# Patient Record
Sex: Male | Born: 1980 | Race: White | Hispanic: No | Marital: Married | Smoking: Current every day smoker
Health system: Southern US, Community
[De-identification: ages and names within clinical notes are randomized; demographics above are authoritative.]

## PROBLEM LIST (undated history)

## (undated) DIAGNOSIS — K589 Irritable bowel syndrome without diarrhea: Secondary | ICD-10-CM

## (undated) DIAGNOSIS — Z973 Presence of spectacles and contact lenses: Secondary | ICD-10-CM

## (undated) DIAGNOSIS — E66811 Obesity, class 1: Secondary | ICD-10-CM

## (undated) DIAGNOSIS — K219 Gastro-esophageal reflux disease without esophagitis: Secondary | ICD-10-CM

## (undated) DIAGNOSIS — Z87442 Personal history of urinary calculi: Secondary | ICD-10-CM

## (undated) DIAGNOSIS — E669 Obesity, unspecified: Secondary | ICD-10-CM

## (undated) DIAGNOSIS — E8881 Metabolic syndrome: Secondary | ICD-10-CM

## (undated) DIAGNOSIS — G4733 Obstructive sleep apnea (adult) (pediatric): Secondary | ICD-10-CM

## (undated) DIAGNOSIS — R519 Headache, unspecified: Secondary | ICD-10-CM

## (undated) DIAGNOSIS — R51 Headache: Secondary | ICD-10-CM

## (undated) HISTORY — DX: Irritable bowel syndrome, unspecified: K58.9

## (undated) HISTORY — PX: LEG SURGERY: SHX1003

## (undated) HISTORY — DX: Obesity, unspecified: E66.9

## (undated) HISTORY — DX: Obesity, class 1: E66.811

## (undated) HISTORY — PX: MULTIPLE TOOTH EXTRACTIONS: SHX2053

## (undated) HISTORY — DX: Metabolic syndrome: E88.81

---
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
## 2003-11-18 ENCOUNTER — Emergency Department (HOSPITAL_COMMUNITY): Admission: EM | Admit: 2003-11-18 | Discharge: 2003-11-18 | Payer: Self-pay | Admitting: Emergency Medicine

## 2005-12-26 ENCOUNTER — Emergency Department (HOSPITAL_COMMUNITY): Admission: EM | Admit: 2005-12-26 | Discharge: 2005-12-26 | Payer: Self-pay | Admitting: Emergency Medicine

## 2005-12-29 ENCOUNTER — Encounter: Admission: RE | Admit: 2005-12-29 | Discharge: 2005-12-29 | Payer: Self-pay | Admitting: Orthopedic Surgery

## 2006-01-06 ENCOUNTER — Ambulatory Visit (HOSPITAL_BASED_OUTPATIENT_CLINIC_OR_DEPARTMENT_OTHER): Admission: RE | Admit: 2006-01-06 | Discharge: 2006-01-07 | Payer: Self-pay | Admitting: Orthopedic Surgery

## 2007-09-20 ENCOUNTER — Encounter: Admission: RE | Admit: 2007-09-20 | Discharge: 2007-09-20 | Payer: Self-pay | Admitting: Family Medicine

## 2008-06-27 ENCOUNTER — Emergency Department (HOSPITAL_BASED_OUTPATIENT_CLINIC_OR_DEPARTMENT_OTHER): Admission: EM | Admit: 2008-06-27 | Discharge: 2008-06-28 | Payer: Self-pay | Admitting: Emergency Medicine

## 2010-10-07 LAB — BASIC METABOLIC PANEL
BUN: 21 mg/dL (ref 6–23)
CO2: 25 mEq/L (ref 19–32)
Calcium: 9.5 mg/dL (ref 8.4–10.5)
Chloride: 105 mEq/L (ref 96–112)
Creatinine, Ser: 0.8 mg/dL (ref 0.4–1.5)
GFR calc Af Amer: >60 mL/min (ref >60)
GFR calc non Af Amer: >60 mL/min (ref >60)
Glucose, Bld: 96 mg/dL (ref 70–99)
Potassium: 3.8 mEq/L (ref 3.5–5.1)
Sodium: 140 mEq/L (ref 135–145)

## 2010-11-08 NOTE — Op Note (Signed)
NAME:  Caleb Norris, Caleb Norris             ACCOUNT NO.:  [id number]   MEDICAL RECORD NO.:  [id number]          PATIENT TYPE:  AMB   LOCATION:  DSC                          FACILITY:  [facility]   PHYSICIAN:  Leonides Grills, M.D.     DATE OF BIRTH:  1981-03-27   DATE OF PROCEDURE:  DATE OF DISCHARGE:                                 OPERATIVE REPORT   PREOPERATIVE DIAGNOSES:  1.  Right closed pilon fracture.  2.  Right closed tibial shaft fracture.   POSTOPERATIVE DIAGNOSES:  1.  Right closed pilon fracture.  2.  Right closed tibial shaft fracture.   OPERATION:  1.  Open reduction internal fixation right pilon fracture.  2.  Open reduction internal fixation right tibial shaft fracture.  3.  Stress x-rays, right ankle.   ANESTHESIA:  General.   SURGEON:  Leonides Grills, MD   ASSISTANT:  None.   COMPLICATIONS:  None.   DISPOSITION:  Stable to PR.   INDICATIONS:  This is a 30 year old male who sustained the above injury.  He  has consented to the above procedure.  All risks of the surgery include  infection, neurovascular injury, non malunion, hardware irritation, hardware  failure, persistent pain, worse pain, arthritis and possible future surgery  were all explained.  Questions were encouraged and answered .  The patient  was brought to the operating room, placed in the supine after adequate  general endotracheal tube anesthesia was administered as well as Ancef 1 g  IV piggyback.  A bump was placed on the right ipsilateral hip, slightly  internal rotated the right lower extremity, and the right lower extremity  was prepped and draped in a sterile manner and proximal thigh tourniquet  placed and the tourniquet was inflated to 90 mmHg.  Under C-arm guidance,  the fracture site was mapped out.  Once this done, we then made a longitude  incision to the posterior lateral aspect of the tibia.  Dissection was  carried down through the skin.  Hemostasis was obtained.  Fascia was incised  over  the superficial posterior compartment and then dissection was carried  down directly onto the tibia, and soft tissue was then elevated off the  fracture site and medially.  We then made a longitude incision midline over  the fracture site just posterolaterally, and with a nick and spread  technique down to bone.  We then applied a 6-hole one-third tubular plate to  the fracture site once the fracture site was debrided and near anatomical  reduction with a reduction clamp.  With the plate applied, we then reduced  the fracture anatomically.  This was visualized under C-arm guidance AP and  lateral planes.  It was mostly out to length.  There was still a rotational  component on the lateral aspect of the tibia that was near anatomic, but we  did not want to devitalize the bone and do a separate exposure for this.  We  then placed four 3.5 mm fully threaded cortical set screws using 2.5 mm  drill holes respectively.  This had excellent purchase and maintenance of  the reduction.  Stress x-rays were obtained in AP and lateral planes that  showed near anatomic reduction with the fracture out length, fixation to  proper position,k as well as no gross motion across the fracture site.  We  then, under C-arm guidance, identified the ankle joint and then made a  longitude incision anterolaterally about the ankle.  Dissection was carried  out through skin.  Hemostasis was obtained.  Peres tertius was then  retracted medially, and the superficial perineal was identified and  retracted out of harm's way as well.  We then went directly down to the  distal anterolateral tibia, and under C-arm guidance we placed two 3.5 mm  fully threaded cortical lag screws using 3.5, 2.5 mm drill holes  respectively.  This had excellent purchase and compression of the fracture  site.  Under C-arm guidance AP lateral mortis views showed anatomic  reduction of the pilon fracture and all planes.  The area was copiously   irrigated with normal saline.  Tourniquet was deflated.  Hemostasis was  obtained.  Compartments were soft before and after the procedure, the foot  and leg.  And prior to this surgery as well, there were wrinkles in the  skin.  Once the wounds were copiously irrigated with normal saline, the  subcu was closed with 3-0 Vicryl and 2-0 Vicryl.  The skin was closed with 4-  0 nylon over all wounds.  Sterile dressing was applied.  Modified Jones  dressing was applied.  Patient was stable to PR.      Leonides Grills, M.D.  Electronically Signed     PB/MEDQ  D:  01/06/2006  T:  01/07/2006  Job:  [id number]

## 2015-02-06 ENCOUNTER — Other Ambulatory Visit: Payer: Self-pay | Admitting: Physician Assistant

## 2015-02-06 DIAGNOSIS — IMO0002 Reserved for concepts with insufficient information to code with codable children: Secondary | ICD-10-CM

## 2015-02-06 DIAGNOSIS — R229 Localized swelling, mass and lump, unspecified: Principal | ICD-10-CM

## 2015-02-07 ENCOUNTER — Ambulatory Visit (INDEPENDENT_AMBULATORY_CARE_PROVIDER_SITE_OTHER): Payer: PRIVATE HEALTH INSURANCE

## 2015-02-07 DIAGNOSIS — R229 Localized swelling, mass and lump, unspecified: Secondary | ICD-10-CM | POA: Diagnosis not present

## 2015-02-07 DIAGNOSIS — IMO0002 Reserved for concepts with insufficient information to code with codable children: Secondary | ICD-10-CM

## 2015-02-08 ENCOUNTER — Other Ambulatory Visit: Payer: Self-pay | Admitting: Physician Assistant

## 2015-02-08 ENCOUNTER — Ambulatory Visit (INDEPENDENT_AMBULATORY_CARE_PROVIDER_SITE_OTHER): Payer: PRIVATE HEALTH INSURANCE

## 2015-02-08 DIAGNOSIS — R221 Localized swelling, mass and lump, neck: Secondary | ICD-10-CM

## 2015-02-08 MED ORDER — IOHEXOL 300 MG/ML  SOLN: 75.0000 mL | Freq: Once | INTRAMUSCULAR | Status: DC | PRN

## 2015-02-15 ENCOUNTER — Other Ambulatory Visit: Payer: Self-pay | Admitting: Otolaryngology

## 2015-02-15 ENCOUNTER — Other Ambulatory Visit (HOSPITAL_COMMUNITY)
Admission: RE | Admit: 2015-02-15 | Discharge: 2015-02-15 | Disposition: A | Discharge status: Home / Self Care | Payer: PRIVATE HEALTH INSURANCE | Source: Ambulatory Visit | Attending: Otolaryngology | Admitting: Otolaryngology

## 2015-02-15 DIAGNOSIS — R221 Localized swelling, mass and lump, neck: Secondary | ICD-10-CM | POA: Insufficient documentation

## 2015-02-23 ENCOUNTER — Other Ambulatory Visit (HOSPITAL_COMMUNITY): Payer: Self-pay | Admitting: Otolaryngology

## 2015-03-12 ENCOUNTER — Encounter (HOSPITAL_COMMUNITY): Payer: Self-pay

## 2015-03-12 ENCOUNTER — Encounter (HOSPITAL_COMMUNITY)
Admission: RE | Admit: 2015-03-12 | Discharge: 2015-03-12 | Disposition: A | Discharge status: Home / Self Care | Payer: PRIVATE HEALTH INSURANCE | Source: Ambulatory Visit | Attending: Otolaryngology | Admitting: Otolaryngology

## 2015-03-12 DIAGNOSIS — Z01812 Encounter for preprocedural laboratory examination: Secondary | ICD-10-CM | POA: Insufficient documentation

## 2015-03-12 DIAGNOSIS — R221 Localized swelling, mass and lump, neck: Secondary | ICD-10-CM | POA: Insufficient documentation

## 2015-03-12 HISTORY — DX: Headache: R51

## 2015-03-12 HISTORY — DX: Personal history of urinary calculi: Z87.442

## 2015-03-12 HISTORY — DX: Gastro-esophageal reflux disease without esophagitis: K21.9

## 2015-03-12 HISTORY — DX: Headache, unspecified: R51.9

## 2015-03-12 LAB — CBC
HCT: 41.1 % (ref 39.0–52.0)
Hemoglobin: 13.4 g/dL (ref 13.0–17.0)
MCH: 27.6 pg (ref 26.0–34.0)
MCHC: 32.6 g/dL (ref 30.0–36.0)
MCV: 84.6 fL (ref 78.0–100.0)
Platelets: 330 10*3/uL (ref 150–400)
RBC: 4.86 MIL/uL (ref 4.22–5.81)
RDW: 12.7 % (ref 11.5–15.5)
WBC: 10.3 10*3/uL (ref 4.0–10.5)

## 2015-03-12 LAB — BASIC METABOLIC PANEL
Anion gap: 13 (ref 5–15)
BUN: 11 mg/dL (ref 6–20)
CO2: 22 mmol/L (ref 22–32)
Calcium: 9.5 mg/dL (ref 8.9–10.3)
Chloride: 104 mmol/L (ref 101–111)
Creatinine, Ser: 0.98 mg/dL (ref 0.61–1.24)
GFR calc Af Amer: >60 mL/min (ref >60)
GFR calc non Af Amer: >60 mL/min (ref >60)
Glucose, Bld: 101 mg/dL — ABNORMAL HIGH (ref 65–99)
Potassium: 3.8 mmol/L (ref 3.5–5.1)
Sodium: 139 mmol/L (ref 135–145)

## 2015-03-12 NOTE — Pre-Procedure Instructions (Signed)
    Caleb Norris  03/12/2015     No Pharmacies Listed   Your procedure is scheduled on Friday, September 23rd, 2016.  Report to [facility] Admitting at 5:30 A.M.  Call this number if you have problems the morning of surgery:  [phone]   Remember:  Do not eat food or drink liquids after midnight.  Take these medicines the morning of surgery with A SIP OF WATER: Protonix and Amitriptyline.   Stop taking: NSAIDs, Aspirin, Aleve, Naproxen, Ibuprofen, BC's, Goody's, Motrin, Advil, fish oil, all herbal medications, and all vitamins.    Do not wear jewelry.  Do not wear lotions, powders, or colognes.  You may wear deodorant.  Men may shave face and neck.  Do not bring valuables to the hospital.  [facility] is not responsible for any belongings or valuables.  Contacts, dentures or bridgework may not be worn into surgery.  Leave your suitcase in the car.  After surgery it may be brought to your room.  For patients admitted to the hospital, discharge time will be determined by your treatment team.  Patients discharged the day of surgery will not be allowed to drive home.   Special instructions:  See attached.   Please read over the following fact sheets that you were given. Pain Booklet, Coughing and Deep Breathing and Surgical Site Infection Prevention

## 2015-03-16 ENCOUNTER — Ambulatory Visit (HOSPITAL_COMMUNITY): Payer: PRIVATE HEALTH INSURANCE | Admitting: Anesthesiology

## 2015-03-16 ENCOUNTER — Encounter (HOSPITAL_COMMUNITY): Payer: Self-pay | Admitting: *Deleted

## 2015-03-16 ENCOUNTER — Observation Stay (HOSPITAL_COMMUNITY)
Admission: RE | Admit: 2015-03-16 | Discharge: 2015-03-17 | Disposition: A | Discharge status: Home / Self Care | Payer: PRIVATE HEALTH INSURANCE | Source: Ambulatory Visit | Attending: Otolaryngology | Admitting: Otolaryngology

## 2015-03-16 ENCOUNTER — Encounter (HOSPITAL_COMMUNITY)
Admission: RE | Disposition: A | Discharge status: Home / Self Care | Payer: Self-pay | Source: Ambulatory Visit | Attending: Otolaryngology

## 2015-03-16 DIAGNOSIS — Z87891 Personal history of nicotine dependence: Secondary | ICD-10-CM | POA: Diagnosis not present

## 2015-03-16 DIAGNOSIS — Z6833 Body mass index (BMI) 33.0-33.9, adult: Secondary | ICD-10-CM | POA: Diagnosis not present

## 2015-03-16 DIAGNOSIS — K219 Gastro-esophageal reflux disease without esophagitis: Secondary | ICD-10-CM | POA: Diagnosis not present

## 2015-03-16 DIAGNOSIS — Q180 Sinus, fistula and cyst of branchial cleft: Secondary | ICD-10-CM | POA: Diagnosis not present

## 2015-03-16 HISTORY — PX: THYROGLOSSAL DUCT CYST: SHX297

## 2015-03-16 SURGERY — EXCISION, THYROGLOSSAL DUCT CYST
Anesthesia: General | Site: Neck | Laterality: Left

## 2015-03-16 MED ORDER — GUAIFENESIN-DM 100-10 MG/5ML PO SYRP: 10.0000 mL | ORAL_SOLUTION | ORAL | Status: DC | PRN

## 2015-03-16 MED ORDER — LIDOCAINE-EPINEPHRINE 1 %-1:100000 IJ SOLN
INTRAMUSCULAR | Status: AC
  Filled 2015-03-16: qty 1

## 2015-03-16 MED ORDER — PANTOPRAZOLE SODIUM 40 MG PO TBEC: 40.0000 mg | DELAYED_RELEASE_TABLET | Freq: Every day | ORAL | Status: DC

## 2015-03-16 MED ORDER — HYDROCORTISONE 1 % EX CREA
1.0000 "application " | TOPICAL_CREAM | Freq: Three times a day (TID) | CUTANEOUS | Status: DC | PRN
  Filled 2015-03-16: qty 28

## 2015-03-16 MED ORDER — GLYCOPYRROLATE 0.2 MG/ML IJ SOLN
INTRAMUSCULAR | Status: AC
  Filled 2015-03-16: qty 1

## 2015-03-16 MED ORDER — FENTANYL CITRATE (PF) 100 MCG/2ML IJ SOLN
25.0000 ug | INTRAMUSCULAR | Status: DC | PRN
  Administered 2015-03-16 (×2): 50 ug via INTRAVENOUS

## 2015-03-16 MED ORDER — DIPHENHYDRAMINE HCL 50 MG/ML IJ SOLN: 25.0000 mg | Freq: Four times a day (QID) | INTRAMUSCULAR | Status: DC | PRN

## 2015-03-16 MED ORDER — PROPOFOL 10 MG/ML IV BOLUS
INTRAVENOUS | Status: DC | PRN
  Administered 2015-03-16: 160 mg via INTRAVENOUS
  Administered 2015-03-16: 40 mg via INTRAVENOUS

## 2015-03-16 MED ORDER — NEOSTIGMINE METHYLSULFATE 10 MG/10ML IV SOLN
INTRAVENOUS | Status: AC
  Filled 2015-03-16: qty 1

## 2015-03-16 MED ORDER — ARTIFICIAL TEARS OP OINT
TOPICAL_OINTMENT | OPHTHALMIC | Status: DC | PRN
  Administered 2015-03-16: 1 via OPHTHALMIC

## 2015-03-16 MED ORDER — LIDOCAINE HCL (CARDIAC) 20 MG/ML IV SOLN
INTRAVENOUS | Status: AC
  Filled 2015-03-16: qty 10

## 2015-03-16 MED ORDER — DOCUSATE SODIUM 100 MG PO CAPS: 100.0000 mg | ORAL_CAPSULE | Freq: Two times a day (BID) | ORAL | Status: DC | PRN

## 2015-03-16 MED ORDER — FENTANYL CITRATE (PF) 250 MCG/5ML IJ SOLN
INTRAMUSCULAR | Status: AC
  Filled 2015-03-16: qty 5

## 2015-03-16 MED ORDER — METRONIDAZOLE 500 MG PO TABS
500.0000 mg | ORAL_TABLET | Freq: Two times a day (BID) | ORAL | Status: DC
  Administered 2015-03-16: 500 mg via ORAL
  Filled 2015-03-16: qty 1

## 2015-03-16 MED ORDER — VITAMIN B-12 1000 MCG PO TABS: 500.0000 ug | ORAL_TABLET | Freq: Every day | ORAL | Status: DC

## 2015-03-16 MED ORDER — CEFAZOLIN SODIUM 1-5 GM-% IV SOLN
1.0000 g | Freq: Three times a day (TID) | INTRAVENOUS | Status: DC
  Filled 2015-03-16 (×3): qty 50

## 2015-03-16 MED ORDER — 0.9 % SODIUM CHLORIDE (POUR BTL) OPTIME
TOPICAL | Status: DC | PRN
  Administered 2015-03-16: 1000 mL

## 2015-03-16 MED ORDER — MORPHINE SULFATE (PF) 2 MG/ML IV SOLN: 2.0000 mg | INTRAVENOUS | Status: DC | PRN

## 2015-03-16 MED ORDER — KCL IN DEXTROSE-NACL 20-5-0.45 MEQ/L-%-% IV SOLN
INTRAVENOUS | Status: DC
  Administered 2015-03-16: 75 mL/h via INTRAVENOUS
  Administered 2015-03-17: via INTRAVENOUS
  Filled 2015-03-16 (×2): qty 1000

## 2015-03-16 MED ORDER — ALUM & MAG HYDROXIDE-SIMETH 200-200-20 MG/5ML PO SUSP: 30.0000 mL | Freq: Four times a day (QID) | ORAL | Status: DC | PRN

## 2015-03-16 MED ORDER — HYDROCODONE-ACETAMINOPHEN 5-325 MG PO TABS
1.0000 | ORAL_TABLET | ORAL | Status: DC | PRN
  Administered 2015-03-16 – 2015-03-17 (×2): 2 via ORAL
  Filled 2015-03-16 (×3): qty 2

## 2015-03-16 MED ORDER — LIDOCAINE-EPINEPHRINE 1 %-1:100000 IJ SOLN
INTRAMUSCULAR | Status: DC | PRN
  Administered 2015-03-16: 3.5 mL

## 2015-03-16 MED ORDER — ROCURONIUM BROMIDE 50 MG/5ML IV SOLN
INTRAVENOUS | Status: AC
  Filled 2015-03-16: qty 1

## 2015-03-16 MED ORDER — OXYCODONE HCL 5 MG PO TABS: 5.0000 mg | ORAL_TABLET | Freq: Once | ORAL | Status: DC | PRN

## 2015-03-16 MED ORDER — ONDANSETRON HCL 4 MG/2ML IJ SOLN: 4.0000 mg | Freq: Four times a day (QID) | INTRAMUSCULAR | Status: DC | PRN

## 2015-03-16 MED ORDER — ONDANSETRON HCL 4 MG/2ML IJ SOLN
INTRAMUSCULAR | Status: AC
  Filled 2015-03-16: qty 2

## 2015-03-16 MED ORDER — MIDAZOLAM HCL 5 MG/5ML IJ SOLN
INTRAMUSCULAR | Status: DC | PRN
  Administered 2015-03-16: 2 mg via INTRAVENOUS

## 2015-03-16 MED ORDER — LIDOCAINE HCL (CARDIAC) 20 MG/ML IV SOLN
INTRAVENOUS | Status: DC | PRN
  Administered 2015-03-16: 80 mg via INTRAVENOUS

## 2015-03-16 MED ORDER — NEOSTIGMINE METHYLSULFATE 10 MG/10ML IV SOLN
INTRAVENOUS | Status: DC | PRN
  Administered 2015-03-16: 2 mg via INTRAVENOUS

## 2015-03-16 MED ORDER — OXYCODONE HCL 5 MG/5ML PO SOLN: 5.0000 mg | Freq: Once | ORAL | Status: DC | PRN

## 2015-03-16 MED ORDER — ACETAMINOPHEN 325 MG PO TABS: 650.0000 mg | ORAL_TABLET | ORAL | Status: DC | PRN

## 2015-03-16 MED ORDER — GLYCOPYRROLATE 0.2 MG/ML IJ SOLN
INTRAMUSCULAR | Status: DC | PRN
  Administered 2015-03-16: 0.2 mg via INTRAVENOUS

## 2015-03-16 MED ORDER — BACITRACIN ZINC 500 UNIT/GM EX OINT
TOPICAL_OINTMENT | CUTANEOUS | Status: AC
  Filled 2015-03-16: qty 28.35

## 2015-03-16 MED ORDER — B COMPLEX PO TABS: 1.0000 | ORAL_TABLET | Freq: Every day | ORAL | Status: DC

## 2015-03-16 MED ORDER — ONDANSETRON 4 MG PO TBDP: 4.0000 mg | ORAL_TABLET | Freq: Three times a day (TID) | ORAL | Status: DC | PRN

## 2015-03-16 MED ORDER — SUCCINYLCHOLINE CHLORIDE 20 MG/ML IJ SOLN
INTRAMUSCULAR | Status: AC
  Filled 2015-03-16: qty 1

## 2015-03-16 MED ORDER — PHENOL 1.4 % MT LIQD
2.0000 | Freq: Three times a day (TID) | OROMUCOSAL | Status: DC | PRN
  Administered 2015-03-17: 2 via OROMUCOSAL
  Filled 2015-03-16: qty 177

## 2015-03-16 MED ORDER — CLINDAMYCIN HCL 150 MG PO CAPS
150.0000 mg | ORAL_CAPSULE | Freq: Four times a day (QID) | ORAL | Status: DC
  Administered 2015-03-16 – 2015-03-17 (×3): 150 mg via ORAL
  Filled 2015-03-16 (×7): qty 1

## 2015-03-16 MED ORDER — PROPOFOL 10 MG/ML IV BOLUS
INTRAVENOUS | Status: AC
  Filled 2015-03-16: qty 20

## 2015-03-16 MED ORDER — ROCURONIUM BROMIDE 100 MG/10ML IV SOLN
INTRAVENOUS | Status: DC | PRN
  Administered 2015-03-16: 40 mg via INTRAVENOUS

## 2015-03-16 MED ORDER — SODIUM CHLORIDE 0.9 % IJ SOLN
INTRAMUSCULAR | Status: AC
  Filled 2015-03-16: qty 10

## 2015-03-16 MED ORDER — OXYMETAZOLINE HCL 0.05 % NA SOLN
2.0000 | Freq: Three times a day (TID) | NASAL | Status: DC | PRN
  Administered 2015-03-16: 2 via NASAL
  Filled 2015-03-16: qty 15

## 2015-03-16 MED ORDER — LACTATED RINGERS IV SOLN
INTRAVENOUS | Status: DC | PRN
  Administered 2015-03-16 (×2): via INTRAVENOUS

## 2015-03-16 MED ORDER — PHENYLEPHRINE HCL 10 MG/ML IJ SOLN
INTRAMUSCULAR | Status: DC | PRN
  Administered 2015-03-16: 40 ug via INTRAVENOUS

## 2015-03-16 MED ORDER — ACETAMINOPHEN 160 MG/5ML PO SOLN
325.0000 mg | ORAL | Status: DC | PRN
  Filled 2015-03-16: qty 20.3

## 2015-03-16 MED ORDER — AMITRIPTYLINE HCL 10 MG PO TABS: 10.0000 mg | ORAL_TABLET | Freq: Every day | ORAL | Status: DC

## 2015-03-16 MED ORDER — B COMPLEX-C PO TABS
1.0000 | ORAL_TABLET | Freq: Every day | ORAL | Status: DC
  Filled 2015-03-16: qty 1

## 2015-03-16 MED ORDER — MIDAZOLAM HCL 2 MG/2ML IJ SOLN
INTRAMUSCULAR | Status: AC
  Filled 2015-03-16: qty 4

## 2015-03-16 MED ORDER — ONDANSETRON HCL 4 MG/2ML IJ SOLN
INTRAMUSCULAR | Status: DC | PRN
  Administered 2015-03-16: 4 mg via INTRAVENOUS

## 2015-03-16 MED ORDER — EPHEDRINE SULFATE 50 MG/ML IJ SOLN
INTRAMUSCULAR | Status: AC
  Filled 2015-03-16: qty 1

## 2015-03-16 MED ORDER — PHENYLEPHRINE 40 MCG/ML (10ML) SYRINGE FOR IV PUSH (FOR BLOOD PRESSURE SUPPORT)
PREFILLED_SYRINGE | INTRAVENOUS | Status: AC
  Filled 2015-03-16: qty 10

## 2015-03-16 MED ORDER — CEFAZOLIN SODIUM-DEXTROSE 2-3 GM-% IV SOLR
INTRAVENOUS | Status: AC
  Filled 2015-03-16: qty 50

## 2015-03-16 MED ORDER — FENTANYL CITRATE (PF) 100 MCG/2ML IJ SOLN
INTRAMUSCULAR | Status: AC
  Administered 2015-03-16: 50 ug via INTRAVENOUS
  Filled 2015-03-16: qty 2

## 2015-03-16 MED ORDER — ACETAMINOPHEN 325 MG PO TABS: 325.0000 mg | ORAL_TABLET | ORAL | Status: DC | PRN

## 2015-03-16 MED ORDER — KCL IN DEXTROSE-NACL 20-5-0.45 MEQ/L-%-% IV SOLN
INTRAVENOUS | Status: AC
  Administered 2015-03-16: 75 mL/h via INTRAVENOUS
  Filled 2015-03-16: qty 1000

## 2015-03-16 MED ORDER — FENTANYL CITRATE (PF) 100 MCG/2ML IJ SOLN
INTRAMUSCULAR | Status: DC | PRN
  Administered 2015-03-16: 50 ug via INTRAVENOUS
  Administered 2015-03-16: 150 ug via INTRAVENOUS
  Administered 2015-03-16: 50 ug via INTRAVENOUS
  Administered 2015-03-16: 100 ug via INTRAVENOUS

## 2015-03-16 SURGICAL SUPPLY — 59 items
ATTRACTOMAT 16X20 MAGNETIC DRP (DRAPES) IMPLANT
BENZOIN TINCTURE PRP APPL 2/3 (GAUZE/BANDAGES/DRESSINGS) ×2 IMPLANT
BLADE SURG 15 STRL LF DISP TIS (BLADE) ×1 IMPLANT
BLADE SURG 15 STRL SS (BLADE) ×1
BLADE SURG ROTATE 9660 (MISCELLANEOUS) IMPLANT
BNDG GAUZE ELAST 4 BULKY (GAUZE/BANDAGES/DRESSINGS) ×2 IMPLANT
CLEANER TIP ELECTROSURG 2X2 (MISCELLANEOUS) ×2 IMPLANT
CONT SPEC 4OZ CLIKSEAL STRL BL (MISCELLANEOUS) IMPLANT
CORDS BIPOLAR (ELECTRODE) ×2 IMPLANT
COVER SURGICAL LIGHT HANDLE (MISCELLANEOUS) ×2 IMPLANT
CRADLE DONUT ADULT HEAD (MISCELLANEOUS) IMPLANT
DECANTER SPIKE VIAL GLASS SM (MISCELLANEOUS) ×2 IMPLANT
DRAIN HEMOVAC 7FR (DRAIN) ×2 IMPLANT
DRAIN PENROSE 1/4X12 LTX STRL (WOUND CARE) ×2 IMPLANT
DRAPE PROXIMA HALF (DRAPES) IMPLANT
DRAPE SURG 17X23 STRL (DRAPES) ×2 IMPLANT
ELECT COATED BLADE 2.86 ST (ELECTRODE) ×2 IMPLANT
ELECT REM PT RETURN 9FT ADLT (ELECTROSURGICAL) ×2
ELECTRODE REM PT RTRN 9FT ADLT (ELECTROSURGICAL) ×1 IMPLANT
EVACUATOR SILICONE 100CC (DRAIN) ×2 IMPLANT
FORCEPS BIPOLAR SPETZLER 8 1.0 (NEUROSURGERY SUPPLIES) ×2 IMPLANT
GAUZE SPONGE 4X4 12PLY STRL (GAUZE/BANDAGES/DRESSINGS) ×2 IMPLANT
GAUZE SPONGE 4X4 16PLY XRAY LF (GAUZE/BANDAGES/DRESSINGS) ×2 IMPLANT
GLOVE BIO SURGEON STRL SZ7.5 (GLOVE) ×2 IMPLANT
GOWN STRL REUS W/ TWL LRG LVL3 (GOWN DISPOSABLE) ×2 IMPLANT
GOWN STRL REUS W/TWL LRG LVL3 (GOWN DISPOSABLE) ×2
KIT BASIN OR (CUSTOM PROCEDURE TRAY) ×2 IMPLANT
KIT ROOM TURNOVER OR (KITS) ×2 IMPLANT
LIQUID BAND (GAUZE/BANDAGES/DRESSINGS) ×2 IMPLANT
LOCATOR NERVE 3 VOLT (DISPOSABLE) IMPLANT
NEEDLE BIOPSY 14X6 SOFT TISS (NEEDLE) IMPLANT
NEEDLE HYPO 25GX1X1/2 BEV (NEEDLE) IMPLANT
NS IRRIG 1000ML POUR BTL (IV SOLUTION) ×2 IMPLANT
PAD ARMBOARD 7.5X6 YLW CONV (MISCELLANEOUS) ×4 IMPLANT
PENCIL BUTTON HOLSTER BLD 10FT (ELECTRODE) ×2 IMPLANT
SPECIMEN JAR SMALL (MISCELLANEOUS) ×2 IMPLANT
SPONGE INTESTINAL PEANUT (DISPOSABLE) IMPLANT
SPONGE LAP 18X18 X RAY DECT (DISPOSABLE) ×2 IMPLANT
STAPLER VISISTAT 35W (STAPLE) ×2 IMPLANT
SUT ETHILON 2 0 FS 18 (SUTURE) ×4 IMPLANT
SUT ETHILON 5 0 P 3 18 (SUTURE) ×1
SUT NYLON ETHILON 5-0 P-3 1X18 (SUTURE) ×1 IMPLANT
SUT SILK 2 0 SH (SUTURE) ×4 IMPLANT
SUT SILK 3 0 REEL (SUTURE) ×2 IMPLANT
SUT VIC AB 3-0 SH 27 (SUTURE) ×1
SUT VIC AB 3-0 SH 27X BRD (SUTURE) ×1 IMPLANT
SUT VIC AB 4-0 PS2 27 (SUTURE) ×2 IMPLANT
SUT VIC AB 4-0 SH 27 (SUTURE) ×1
SUT VIC AB 4-0 SH 27XBRD (SUTURE) ×1 IMPLANT
SWAB COLLECTION DEVICE MRSA (MISCELLANEOUS) IMPLANT
SYR 5ML LL (SYRINGE) IMPLANT
TOWEL OR 17X24 6PK STRL BLUE (TOWEL DISPOSABLE) ×2 IMPLANT
TRAY ENT MC OR (CUSTOM PROCEDURE TRAY) ×2 IMPLANT
TRAY FOLEY CATH 14FRSI W/METER (CATHETERS) IMPLANT
TUBE ANAEROBIC SPECIMEN COL (MISCELLANEOUS) IMPLANT
TUBE CONNECTING 12X1/4 (SUCTIONS) ×2 IMPLANT
TUBE TRACH SHILEY  6 DIST  CUF (TUBING) IMPLANT
TUBE TRACH SHILEY 8 DIST CUF (TUBING) IMPLANT
WATER STERILE IRR 1000ML POUR (IV SOLUTION) ×2 IMPLANT

## 2015-03-16 NOTE — Anesthesia Preprocedure Evaluation (Addendum)
Anesthesia Evaluation  Patient identified by MRN, date of birth, ID band Patient awake    Reviewed: Allergy & Precautions, NPO status , Patient's Chart, lab work & pertinent test results  History of Anesthesia Complications Negative for: history of anesthetic complications  Airway Mallampati: II  TM Distance: >3 FB Neck ROM: Full    Dental  (+) Teeth Intact   Pulmonary neg shortness of breath, neg sleep apnea, neg COPD, neg recent URI, former smoker,  Quit 5 yrs ago   Pulmonary exam normal breath sounds clear to auscultation       Cardiovascular negative cardio ROS Normal cardiovascular exam Rhythm:Regular Rate:Normal     Neuro/Psych negative neurological ROS  negative psych ROS   GI/Hepatic Neg liver ROS, GERD  Controlled,  Endo/Other  Morbid obesity  Renal/GU negative Renal ROS     Musculoskeletal negative musculoskeletal ROS (+)   Abdominal Normal abdominal exam  (+)   Peds  Hematology negative hematology ROS (+)   Anesthesia Other Findings   Reproductive/Obstetrics negative OB ROS                           Anesthesia Physical Anesthesia Plan  ASA: II  Anesthesia Plan: General   Post-op Pain Management:    Induction: Intravenous  Airway Management Planned: Oral ETT  Additional Equipment: None  Intra-op Plan:   Post-operative Plan: Extubation in OR  Informed Consent: I have reviewed the patients History and Physical, chart, labs and discussed the procedure including the risks, benefits and alternatives for the proposed anesthesia with the patient or authorized representative who has indicated his/her understanding and acceptance.   Dental advisory given  Plan Discussed with: CRNA, Anesthesiologist and Surgeon  Anesthesia Plan Comments:        Anesthesia Quick Evaluation

## 2015-03-16 NOTE — Anesthesia Postprocedure Evaluation (Signed)
  Anesthesia Post-op Note  Patient: Caleb Norris  Procedure(s) Performed: Procedure(s): Excision Left neck cyst (Left)  Patient Location: PACU  Anesthesia Type:General  Level of Consciousness: awake  Airway and Oxygen Therapy: Patient Spontanous Breathing  Post-op Pain: mild  Post-op Assessment: Post-op Vital signs reviewed, Patient's Cardiovascular Status Stable, Respiratory Function Stable, Patent Airway, No signs of Nausea or vomiting and Pain level controlled              Post-op Vital Signs: Reviewed and stable  Last Vitals:  Filed Vitals:   03/16/15 1110  BP: 127/63  Pulse: 86  Temp: 37 C  Resp: 20    Complications: No apparent anesthesia complications

## 2015-03-16 NOTE — Progress Notes (Signed)
Subjective: POD#0 from left neck branchial cleft excision. Expected left neck soreness but otherwise doing well and taking good PO  Objective: Vital signs in last 24 hours: Temp:  [98.6 F (37 C)-99 F (37.2 C)] 98.6 F (37 C) (09/23 1222) Pulse Rate:  [84-92] 88 (09/23 1222) Resp:  [12-20] 20 (09/23 1222) BP: (126-133)/(63-85) 132/85 mmHg (09/23 1222) SpO2:  [95 %-99 %] 98 % (09/23 1222) Weight:  [104.327 kg (230 lb)] 104.327 kg (230 lb) (09/23 1222)  Neck supple, Cn 2-12 intact and symmetric, incision C/D/I with Jp holding suction with ~ 10mL of sanguinous drainage.  (wbc:2,hgb:2,hct:2,plt:2) No results for input(s): NA, K, CL, CO2, GLUCOSE, BUN, CREATININE, CALCIUM in the last 72 hours.  Medications:   Scheduled Meds: . [START ON 03/17/2015] amitriptyline  10 mg Oral Daily  . [START ON 03/17/2015] B-complex with vitamin C  1 tablet Oral Daily  . clindamycin  150 mg Oral 4 times per day  . metroNIDAZOLE  500 mg Oral Q12H  . [START ON 03/17/2015] pantoprazole  40 mg Oral Daily  . cyanocobalamin  500 mcg Oral Daily   Continuous Infusions: . dextrose 5 % and 0.45 % NaCl with KCl 20 mEq/L 75 mL/hr (03/16/15 1104)   PRN Meds:.acetaminophen, diphenhydrAMINE, docusate sodium, HYDROcodone-acetaminophen, morphine injection, ondansetron (ZOFRAN) IV, ondansetron, phenol  Assessment/Plan: Doing well s/p excision left neck branchial cleft cyst, will monitor, likely discharge in the morning after drain removed if drain output appropriate.     Melvenia Beam 03/16/2015, 5:29 PM

## 2015-03-16 NOTE — Progress Notes (Signed)
Arrived to 6n10 from PACU, ambulated to bed-gait steady, oriented to room and surroundings, denies nausea/pain at this time

## 2015-03-16 NOTE — Anesthesia Procedure Notes (Signed)
Procedure Name: Intubation Date/Time: 03/16/2015 8:13 AM Performed by: Izora Gala Pre-anesthesia Checklist: Patient identified, Emergency Drugs available, Suction available and Patient being monitored Patient Re-evaluated:Patient Re-evaluated prior to inductionOxygen Delivery Method: Circle system utilized Preoxygenation: Pre-oxygenation with 100% oxygen Intubation Type: IV induction Ventilation: Mask ventilation without difficulty Laryngoscope Size: Miller and 3 Grade View: Grade I Tube size: 7.5 mm Number of attempts: 1 Airway Equipment and Method: Stylet and LTA kit utilized Placement Confirmation: ETT inserted through vocal cords under direct vision,  positive ETCO2 and breath sounds checked- equal and bilateral Secured at: 22 cm Tube secured with: Tape Dental Injury: Teeth and Oropharynx as per pre-operative assessment

## 2015-03-16 NOTE — Transfer of Care (Signed)
Immediate Anesthesia Transfer of Care Note  Patient: Caleb Norris  Procedure(s) Performed: Procedure(s): Excision Left neck cyst (Left)  Patient Location: PACU  Anesthesia Type:General  Level of Consciousness: awake, alert , oriented and patient cooperative  Airway & Oxygen Therapy: Patient Spontanous Breathing and Patient connected to nasal cannula oxygen  Post-op Assessment: Report given to RN, Post -op Vital signs reviewed and stable and Patient moving all extremities  Post vital signs: Reviewed and stable  Last Vitals:  Filed Vitals:   03/16/15 0556  BP: 133/80  Pulse: 86  Temp: 37.2 C  Resp: 16    Complications: No apparent anesthesia complications

## 2015-03-16 NOTE — Op Note (Signed)
NAME:  Caleb Norris, Caleb Norris NO.:  [id number]  MEDICAL RECORD NO.:  [id number]  LOCATION:  [facility]                         FACILITY:  [facility]  PHYSICIAN:  Antony Contras, MD     DATE OF BIRTH:  [birth year]  DATE OF PROCEDURE:  03/16/2015 DATE OF DISCHARGE:                              OPERATIVE REPORT   PREOPERATIVE DIAGNOSIS:  Left neck branchial cleft cyst.  POSTOPERATIVE DIAGNOSIS:  Left neck branchial cleft cyst.  PROCEDURE:  Excision of left neck branchial cleft cyst.  SURGEON:  Antony Contras, MD  ASSISTANT:  Aquilla Hacker, PA.  ANESTHESIA:  General endotracheal anesthesia.  COMPLICATIONS:  None.  INDICATION:  The patient is a 34 year old male who first noticed a mass in the left neck in early August that has increased in size.  CT imaging demonstrated a cystic mass.  Fine-needle aspiration was inconclusive and a culture from the aspirate was negative.  He presents to the operating room for surgical management.  FINDINGS:  There was a sizable cyst in the left neck in the zone 2-3 region.  The cyst was stuck to the sternocleidomastoid muscle and deep tissues somewhat.  There were couple of satellite lymph nodes that were removed with the cyst.  The cyst extended toward the carotid bifurcation, but did not pass through that area.  The cyst was sent for Pathology.  The cyst did rupture during surgery decompressing partially yellow creamy fluid.  DESCRIPTION OF PROCEDURE:  The patient was identified in the holding room and informed consent having been obtained after discussion of risks, benefits, alternatives, the patient was brought to the operative suite and put on the operating table in a supine position.  Anesthesia was induced and the patient was intubated by the anesthesia team without difficulty.  The patient was given intravenous antibiotics during the case.  The eyes were taped closed and a shoulder roll was placed.  The left neck was prepped and  draped in sterile fashion after first marking the incision with a marking pen and injected with 1% lidocaine with 1:100,000 epinephrine.  After prepping, the neck incision was then made with a 15-blade scalpel and extended through the subcutaneous and platysma layer using Bovie electrocautery.  Subplatysmal flap was elevated superiorly and the superior flap was sutured superiorly to the drapes.  Dissection was then performed down onto the cyst inferiorly and dissection was then performed around the cyst staying along the cyst wall.  This started posteriorly dividing it from the sternocleidomastoid muscle, where muscle fibers had to be transected to allow the cyst to be dissected free.  Continued dissection was then performed over the lateral surface superiorly dividing tissues and staying along the cyst wall.  The digastric muscle was identified superiorly and dissected free.  Dissection was then performed around the more medial surface superior to inferior.  Inferiorly, the cyst was more stuck and was dissected carefully from surrounding tissues.  Ultimately, the jugular vein was identified inferiorly.  Continued dissection around the posterior surface divided the cyst further from the sternocleidomastoid muscle.  More superiorly, the spinal accessory nerve was identified and kept free from the dissection area while dividing tissues further.  It was in this  area that the cyst ruptured and cloudy yellow fluid was suctioned out of the wound until it stopped draining.  Continued dissection was then performed around the anterior and medial surface carefully dissecting the jugular vein from the cyst.  At this point, the cyst was able to be dissected from deep tissues using Bovie electrocautery.  There was an extension of the cyst inferiorly as well as superiorly that were dissected free from the deeper tissues.  The superior extension was oriented toward the carotid bifurcation, but ended  short of that.  Continued dissection was performed under the deep surface of the cyst until it was able to be removed along with a couple of satellite lymph nodes and the cyst was passed to nursing for Pathology.  The wound was then copiously irrigated with saline.  The wound was carefully inspected for bleeding including with a Valsalva maneuver, and there was no extra bleeding seen.  A 7-French round drain was placed in the depth of the wound and secured to the skin edge using 2-0 nylon suture in a standard drain stitch.  The platysma layer was closed with 3-0 Vicryl suture in a simple running fashion.  The drain was hooked to suction at this point.  The skin was closed in the subcutaneous layer using 4-0 Vicryl suture in a simple running fashion. The drain was then converted to bulb suction and the skin was then sealed with Dermabond.  After cleaning off the patient and removing the drapes, the drain was taped to the left shoulder.  He was then returned to anesthesia for wake up, he was extubated and moved to the recovery room in a stable condition.     Antony Contras, MD     DDB/MEDQ  D:  03/16/2015  T:  03/16/2015  Job:  [id number]

## 2015-03-16 NOTE — H&P (Signed)
Caleb Norris is an 34 y.o. male.   Chief Complaint: left neck cyst HPI: 34 year old male noticed a mass in the left neck in early August.  CT imaging suggested a cystic mass.  FNA was non-diagnostic.  Culture from the aspirate was negative.  He presents for surgical excision.  Past Medical History  Diagnosis Date  . GERD (gastroesophageal reflux disease)   . Urinary frequency   . History of kidney stones   . Headache     occassionally    Past Surgical History  Procedure Laterality Date  . Leg surgery Right   . Multiple tooth extractions      History reviewed. No pertinent family history. Social History:  reports that he quit smoking about 5 years ago. His smoking use included Cigarettes. He has never used smokeless tobacco. He reports that he drinks about 1.8 - 2.4 oz of alcohol per week. He reports that he does not use illicit drugs.  Allergies:  Allergies  Allergen Reactions  . Compazine [Prochlorperazine Edisylate] Anaphylaxis    Medications Prior to Admission  Medication Sig Dispense Refill  . amitriptyline (ELAVIL) 10 MG tablet Take 10 mg by mouth daily. For IBS  5  . b complex vitamins tablet Take 1 tablet by mouth daily.    . cyanocobalamin 500 MCG tablet Take 500 mcg by mouth daily. Vitamin B12    . pantoprazole (PROTONIX) 40 MG tablet Take 40 mg by mouth daily.      No results found for this or any previous visit (from the past 48 hour(s)). No results found.  Review of Systems  All other systems reviewed and are negative.   Blood pressure 133/80, pulse 86, temperature 99 F (37.2 C), temperature source Oral, resp. rate 16, SpO2 99 %. Physical Exam  Constitutional: He is oriented to person, place, and time. He appears well-developed and well-nourished. No distress.  HENT:  Head: Normocephalic and atraumatic.  Right Ear: External ear normal.  Left Ear: External ear normal.  Nose: Nose normal.  Mouth/Throat: Oropharynx is clear and moist.  Eyes:  Conjunctivae and EOM are normal. Pupils are equal, round, and reactive to light.  Neck: Normal range of motion. Neck supple.  Left neck with firm, sizeable mass in zone 2-3.  Cardiovascular: Normal rate.   Respiratory: Effort normal.  Musculoskeletal: Normal range of motion.  Neurological: He is alert and oriented to person, place, and time. No cranial nerve deficit.  Skin: Skin is warm and dry.  Psychiatric: He has a normal mood and affect. His behavior is normal. Judgment and thought content normal.     Assessment/Plan Left branchial cleft cyst To OR for excision of left branchial cleft cyst, observe overnight with drain in place.  Avielle Imbert 03/16/2015, 7:51 AM

## 2015-03-16 NOTE — Discharge Summary (Signed)
03/17/15  6:58 AM  Date of Admission: 03/16/2015 Date of Discharge: 03/17/15  Discharge MD: Melvenia Beam, MD  Admitting MD: Christia Reading, MD  Reason for admission/final discharge diagnosis: left neck mass  Labs:see EPIC  Procedure(s) performed: excision left neck mass 03/16/15  Discharge Condition: good  Discharge Exam: Cn 2-12 intact and symmetric, left neck incision intact, left neck drain removed  Discharge Instructions: No heavy lifting, follow up with  Dr. Jenne Pane At [facility] in 1 week, keep incision dry x 72 hours then can clean gently, Rx for clindamycin, zofran, and hydrocodone on chart  Hospital Course: did well after excision left neck mass and drain removed/discharged POD#1  Melvenia Beam 03/17/15

## 2015-03-16 NOTE — Brief Op Note (Signed)
03/16/2015  10:29 AM  PATIENT:  Caleb Norris  34 y.o. male  PRE-OPERATIVE DIAGNOSIS:  left neck cyst  POST-OPERATIVE DIAGNOSIS:  left neck cyst  PROCEDURE:  Procedure(s): Excision Left neck cyst (Left)  SURGEON:  Surgeon(s) and Role:    * Christia Reading, MD - Primary  PHYSICIAN ASSISTANT:   ASSISTANTS: none   ANESTHESIA:   general  EBL:  Total I/O In: 1000 [I.V.:1000] Out: -   BLOOD ADMINISTERED:none  DRAINS: (7 Fr) Jackson-Pratt drain(s) with closed bulb suction in the left neck   LOCAL MEDICATIONS USED:  LIDOCAINE   SPECIMEN:  Source of Specimen:  left neck cyst  DISPOSITION OF SPECIMEN:  PATHOLOGY  COUNTS:  YES  TOURNIQUET:  * No tourniquets in log *  DICTATION: .Other Dictation: Dictation Number [id number]  PLAN OF CARE: Admit for overnight observation  PATIENT DISPOSITION:  PACU - hemodynamically stable.   Delay start of Pharmacological VTE agent (>24hrs) due to surgical blood loss or risk of bleeding: yes

## 2015-03-17 DIAGNOSIS — Q180 Sinus, fistula and cyst of branchial cleft: Secondary | ICD-10-CM | POA: Diagnosis not present

## 2015-03-17 MED ORDER — CLINDAMYCIN HCL 150 MG PO CAPS: 150.0000 mg | ORAL_CAPSULE | Freq: Four times a day (QID) | ORAL | Status: DC

## 2015-03-17 MED ORDER — ONDANSETRON 4 MG PO TBDP: 4.0000 mg | ORAL_TABLET | Freq: Three times a day (TID) | ORAL | Status: DC | PRN

## 2015-03-17 MED ORDER — HYDROCODONE-ACETAMINOPHEN 5-325 MG PO TABS: 1.0000 | ORAL_TABLET | ORAL | Status: DC | PRN

## 2015-03-17 NOTE — Progress Notes (Signed)
Caleb Norris to be D/C'd Home per MD order.  Discussed with the patient and all questions fully answered.  VSS, Skin clean, dry and intact without evidence of skin break down, no evidence of skin tears noted. IV catheter discontinued intact. Site without signs and symptoms of complications. Dressing and pressure applied.  An After Visit Summary was printed and given to the patient. Patient received prescription.  D/c education completed with patient/family including follow up instructions, medication list, d/c activities limitations if indicated, with other d/c instructions as indicated by MD - patient able to verbalize understanding, all questions fully answered.   Patient instructed to return to ED, call 911, or call MD for any changes in condition.   Patient escorted via WC, and D/C home via private auto.  Alonza Bogus 03/17/2015 7:31 AM

## 2015-03-17 NOTE — Discharge Instructions (Signed)
Avoid strenuous activity.  Keep head elevated.  Do not apply any ointment on the incision.  OK to allow incision to get wet after 72 hours, gently pat dry. No heavy lifting, follow up with  Dr. Jenne Pane At [facility] in 1 week, keep incision dry x 72 hours then can clean gently, Rx for clindamycin, zofran, and hydrocodone on chart

## 2015-03-19 ENCOUNTER — Encounter (HOSPITAL_COMMUNITY): Payer: Self-pay | Admitting: Otolaryngology

## 2016-06-09 IMAGING — CT CT NECK W/ CM
1 of 6 series · 3 of 14 positions shown, 4 images · IV contrast (omnipaque)
Comparison: Neck ultrasound 02/07/2015.

CLINICAL DATA: 34-year-old male with recently discovered and
reportedly rapidly enlarging left neck mass. Subsequent encounter.

EXAM:
CT NECK WITH CONTRAST
TECHNIQUE: Multidetector CT imaging of the neck was performed using the
standard protocol following the bolus administration of intravenous
contrast.
CONTRAST:  75 mL Omnipaque 300

[Series 401: sag b · axial · 0.41mm/px · z∈[-120,+1]mm · 3 of 95 slices shown, 4 images]
[im 1/95  soft-tissue]
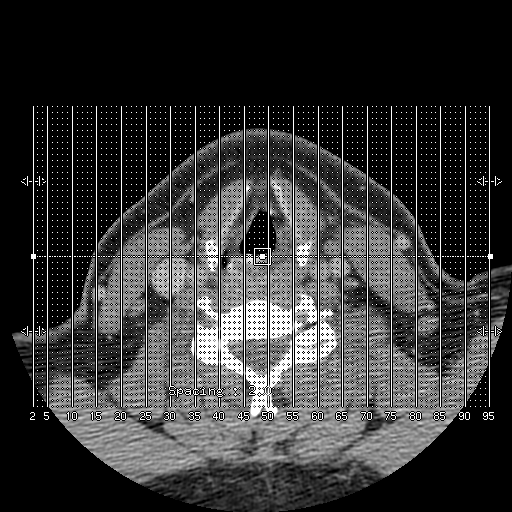
[im 1/95  bone]
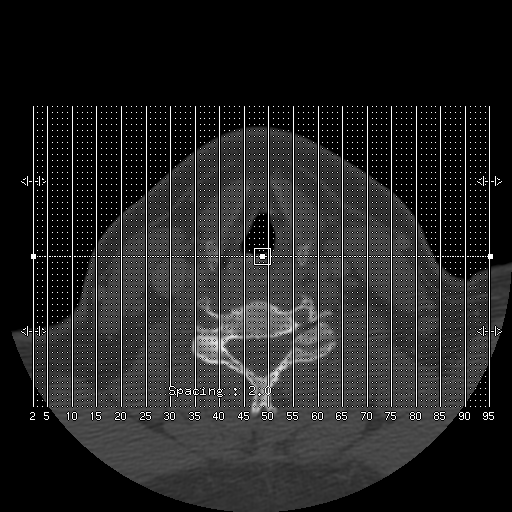
[im 48/95  bone]
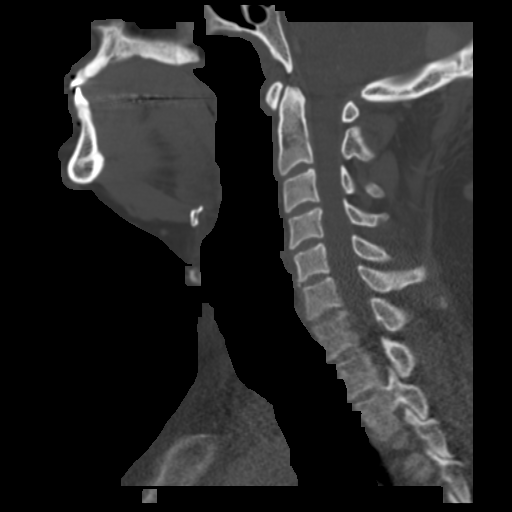
[im 95/95  bone]
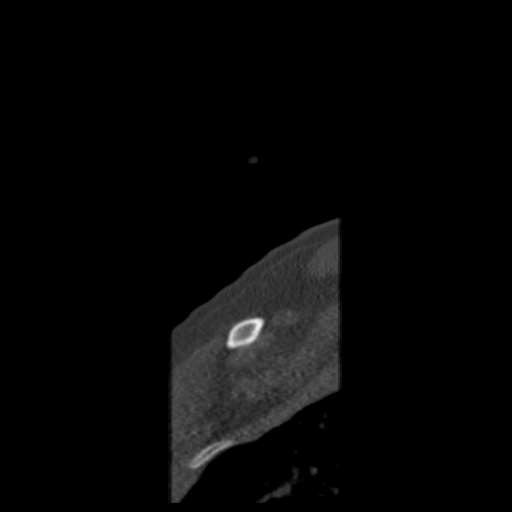

[3 of 14 positions shown; findings below may reference images not displayed]

FINDINGS: Pharynx and larynx: Mild motion artifact. Laryngeal and pharyngeal
soft tissue contours are normal. Negative parapharyngeal spaces.
Occasional tonsillar pillar dystrophic calcifications (post
inflammatory). Negative retropharyngeal space.

Salivary glands: Mild mass effect on the left submandibular gland
which otherwise appears normal. The right submandibular gland, both
parotid glands, and sublingual space are within normal limits.

Thyroid: Negative.

Lymph nodes:

Palpable area of concern marked at the left neck on series 2, image
31. Centered at the left level 2 nodal station there is a large
predominantly cystic 4.4 cm diameter mass with an enhancing soft
tissue or am anteriorly and laterally measuring up to 3-4 mm in
thickness (series 2, image 35). The internal contents of the cystic
lesion have simple fluid densitometry (17 Hounsfield units) and
appear unilocular as on the ultrasound. No hematocrit level
identified. No surrounding soft tissue stranding. Regional mass
effect, including only mild mass effect on the left carotid space
structures. Surrounding left level 1 through level 3 lymph nodes
appear normal.

Other bilateral cervical lymph node stations are normal.

Vascular: Major vascular structures in the neck and at the skullbase
are patent. There is mass effect on the left internal jugular vein.

Limited intracranial: Negative.

Visualized orbits: Not included

Mastoids and visualized paranasal sinuses: Right maxillary sinus
mucous retention cyst. Other Visualized paranasal sinuses and
mastoids are clear.

Skeleton: Mild cervicothoracic scoliosis. No other osseous
abnormality identified.

Upper chest: Negative.  No superior mediastinal lymphadenopathy.
IMPRESSION: 1. Left neck 4.4 cm diameter predominantly cystic mass re-
demonstrated. This is centered at the left level IIa nodal station
and there is a thin (3-4 mm) soft tissue rim along the anterior and
lateral aspect of the lesion.
2. No surrounding inflammation. No pharyngeal mass identified. The
adjacent left level 2 and other bilateral lymph nodes are normal.
3. Strongly favor a benign branchial cleft cyst. The differential
diagnosis does include a necrotic lymph node, so histologic
correlation is necessary.

## 2016-06-23 DIAGNOSIS — E8881 Metabolic syndrome: Secondary | ICD-10-CM

## 2016-06-23 HISTORY — DX: Metabolic syndrome: E88.810

## 2016-06-23 HISTORY — DX: Metabolic syndrome: E88.81

## 2017-03-16 ENCOUNTER — Ambulatory Visit (INDEPENDENT_AMBULATORY_CARE_PROVIDER_SITE_OTHER): Payer: BLUE CROSS/BLUE SHIELD | Admitting: Family Medicine

## 2017-03-16 ENCOUNTER — Encounter: Payer: Self-pay | Admitting: Family Medicine

## 2017-03-16 VITALS — BP 123/78 | HR 87 | Temp 98.0°F | Resp 16 | Ht 69.75 in | Wt 239.2 lb

## 2017-03-16 DIAGNOSIS — K219 Gastro-esophageal reflux disease without esophagitis: Secondary | ICD-10-CM

## 2017-03-16 DIAGNOSIS — Z23 Encounter for immunization: Secondary | ICD-10-CM

## 2017-03-16 DIAGNOSIS — K580 Irritable bowel syndrome with diarrhea: Secondary | ICD-10-CM

## 2017-03-16 MED ORDER — AMITRIPTYLINE HCL 10 MG PO TABS: ORAL_TABLET | ORAL | 3 refills | Status: DC

## 2017-03-16 NOTE — Progress Notes (Signed)
Office Note 03/16/2017  CC:  Chief Complaint  Patient presents with  . Establish Care    Previous PCP: Dr. Tommi Emery at [facility] in [place]  . Follow-up    IBS, needs medication refills    HPI:  Caleb Norris is a 36 y.o. male who is here to establish care Patient's most recent primary MD: Lovenia Kim at [facility] in [place]. Old records in EPIC/[facility] EMR were reviewed prior to or during today's visit.  Has IBS diarrhea predominant---pt says it is mild and amitriptyline  qd-bid helps. No side effects from the med. Needs RF on this med. As longs as pt takes his med daily and maintains avoidance of spicy foods he has no signif problems.  He also has GERD which is well controlled with daily PPI and dietary modifications.  He can't recall the last time he had a complete cPE with labs.  Past Medical History:  Diagnosis Date  . GERD (gastroesophageal reflux disease)   . Headache    occassionally  . History of kidney stones   . IBS (irritable bowel syndrome)     Past Surgical History:  Procedure Laterality Date  . LEG SURGERY Right   . MULTIPLE TOOTH EXTRACTIONS    . THYROGLOSSAL DUCT CYST Left 03/16/2015   Procedure: Excision Left neck cyst;  Surgeon: Christia Reading, MD;  Location: [facility] OR;  Service: ENT;  Laterality: Left;    Family History  Problem Relation Age of Onset  . Hyperlipidemia Father   . Hypertension Father   . Stroke Neg Hx   . Diabetes Neg Hx   . Cancer Neg Hx     Social History   Social History  . Marital status: Married    Spouse name: N/A  . Number of children: N/A  . Years of education: N/A   Occupational History  . Not on file.   Social History Main Topics  . Smoking status: Former Smoker    Types: Cigarettes    Quit date: 02/21/2010  . Smokeless tobacco: Never Used  . Alcohol use 1.8 - 2.4 oz/week    3 - 4 Cans of beer per week     Comment: weekly  . Drug use: No  . Sexual activity: Not on file   Other Topics Concern  . Not on  file   Social History Narrative   Married, 1 son and 1 daughter.   Educ: Associates degree.   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   Tob: former, 15 pack-yr hx.  Quit 2011.   Alcohol: occasional.   Drugs: none.       Outpatient Encounter Prescriptions as of 03/16/2017  Medication Sig  . amitriptyline (ELAVIL) 10 MG tablet 1-2 tabs po qd for IBS  . b complex vitamins tablet Take 1 tablet by mouth daily.  . pantoprazole (PROTONIX) 40 MG tablet Take 40 mg by mouth daily.  . [DISCONTINUED] amitriptyline (ELAVIL) 10 MG tablet Take 10 mg by mouth daily. For IBS  . [DISCONTINUED] clindamycin (CLEOCIN) 150 MG capsule Take 1 capsule (150 mg total) by mouth every 6 (six) hours. (Patient not taking: Reported on 03/16/2017)  . [DISCONTINUED] cyanocobalamin 500 MCG tablet Take 500 mcg by mouth daily. Vitamin B12  . [DISCONTINUED] HYDROcodone-acetaminophen (NORCO/VICODIN) 5-325 MG per tablet Take 1-2 tablets by mouth every 4 (four) hours as needed for moderate pain. (Patient not taking: Reported on 03/16/2017)  . [DISCONTINUED] ondansetron (ZOFRAN-ODT) 4 MG disintegrating tablet Take 1 tablet (4 mg total) by  mouth every 8 (eight) hours as needed for nausea, vomiting or refractory nausea / vomiting. (Patient not taking: Reported on 03/16/2017)   Facility-Administered Encounter Medications as of 03/16/2017  Medication  . iohexol (OMNIPAQUE) 300 MG/ML solution 75 mL    Allergies  Allergen Reactions  . Compazine [Prochlorperazine Edisylate] Anaphylaxis    ROS Review of Systems  Constitutional: Negative for fatigue and fever.  HENT: Negative for congestion and sore throat.   Eyes: Negative for visual disturbance.  Respiratory: Negative for cough.   Cardiovascular: Negative for chest pain.  Gastrointestinal: Negative for abdominal pain and nausea.  Genitourinary: Negative for dysuria.  Musculoskeletal: Negative for back pain and joint swelling.  Skin: Negative for rash.   Neurological: Negative for weakness and headaches.  Hematological: Negative for adenopathy.    PE; Blood pressure 123/78, pulse 87, temperature 98 F (36.7 C), temperature source Oral, resp. rate 16, height 5' 9.75" (1.772 m), weight 239 lb 4 oz (108.5 kg), SpO2 97 %. Body mass index is 34.58 kg/m.  Gen: Alert, well appearing.  Patient is oriented to person, place, time, and situation. AFFECT: pleasant, lucid thought and speech. ONG:EXBM: no injection, icteris, swelling, or exudate.  EOMI, PERRLA. Mouth: lips without lesion/swelling.  Oral mucosa pink and moist. Oropharynx without erythema, exudate, or swelling.  CV: RRR, no m/r/g.   LUNGS: CTA bilat, nonlabored resps, good aeration in all lung fields. ABD: soft, NT, ND, BS normal.  No hepatospenomegaly or mass.  No bruits. EXT: no clubbing, cyanosis, or edema.   Pertinent labs:  No results found for: TSH Lab Results  Component Value Date   WBC 10.3 03/12/2015   HGB 13.4 03/12/2015   HCT 41.1 03/12/2015   MCV 84.6 03/12/2015   PLT 330 03/12/2015   Lab Results  Component Value Date   CREATININE 0.98 03/12/2015   BUN 11 03/12/2015   NA 139 03/12/2015   K 3.8 03/12/2015   CL 104 03/12/2015   CO2 22 03/12/2015   No results found for: ALT, AST, GGT, ALKPHOS, BILITOT No results found for: CHOL No results found for: HDL No results found for: LDLCALC No results found for: TRIG No results found for: CHOLHDL No results found for: PSA  ASSESSMENT AND PLAN:   New pt: obtain prior PCP records.  1) IBS, diarrhea predominant---doing well on long term use of amitriptyline. Takes 1-2 of these  tabs daily and sx's very well controlled. Continue avoidance of spicy foods, RF rx for amitriptyline , 1-2 qd, #180, RF x 3.  2) GERD: well controlled on daily pantoprazole. Continue dietary mod.  Flu vaccine given to patient today.  Return in about 6 months (around 09/13/2017) for annual CPE (fasting) (or earlier than 6 mo if  pt prefers).  Signed:  Santiago Bumpers, MD           03/16/2017

## 2017-03-30 ENCOUNTER — Telehealth: Payer: Self-pay | Admitting: Family Medicine

## 2017-03-30 MED ORDER — PANTOPRAZOLE SODIUM 40 MG PO TBEC: 40.0000 mg | DELAYED_RELEASE_TABLET | Freq: Every day | ORAL | 1 refills | Status: DC

## 2017-03-30 NOTE — Telephone Encounter (Signed)
Rx sent into pharmacy per patient request.

## 2017-03-30 NOTE — Telephone Encounter (Signed)
Pantoprazole [facility]

## 2017-05-27 ENCOUNTER — Telehealth: Payer: Self-pay | Admitting: Family Medicine

## 2017-05-27 NOTE — Telephone Encounter (Signed)
Copied from CRM [id number]#[id number]. Topic: Quick Communication - See Telephone Encounter >> May 27, 2017 10:18 AM Windy KalataMichael, Daishia Fetterly L, NT wrote: CRM for notification. See Telephone encounter for:  05/27/17.  Patient was seen by Dr. Milinda CaveMcgowen on 03/16/17 and at the time still had enough of the amitriptyline, patient is now out and is needing a refill sent to [facility] that is on file.

## 2017-05-27 NOTE — Telephone Encounter (Signed)
Called pt  And informed to call pharmacy. It was filled 03/16/17 for 180 tablets with 3 refills.

## 2017-06-06 ENCOUNTER — Other Ambulatory Visit: Payer: Self-pay | Admitting: Family Medicine

## 2017-06-09 ENCOUNTER — Telehealth: Payer: Self-pay | Admitting: Family Medicine

## 2017-06-09 ENCOUNTER — Encounter: Payer: Self-pay | Admitting: Family Medicine

## 2017-06-09 ENCOUNTER — Ambulatory Visit: Payer: BLUE CROSS/BLUE SHIELD | Admitting: Family Medicine

## 2017-06-09 VITALS — BP 118/72 | HR 116 | Temp 99.2°F | Wt 250.0 lb

## 2017-06-09 DIAGNOSIS — J0110 Acute frontal sinusitis, unspecified: Secondary | ICD-10-CM

## 2017-06-09 DIAGNOSIS — J029 Acute pharyngitis, unspecified: Secondary | ICD-10-CM

## 2017-06-09 LAB — POCT RAPID STREP A (OFFICE): Rapid Strep A Screen: NEGATIVE

## 2017-06-09 MED ORDER — PREDNISONE 50 MG PO TABS: 50.0000 mg | ORAL_TABLET | Freq: Every day | ORAL | 0 refills | Status: DC

## 2017-06-09 MED ORDER — AMOXICILLIN-POT CLAVULANATE 875-125 MG PO TABS: 1.0000 | ORAL_TABLET | Freq: Two times a day (BID) | ORAL | 0 refills | Status: DC

## 2017-06-09 NOTE — Telephone Encounter (Signed)
Patient saw Dr Claiborne BillingsKuneff today, please advise.

## 2017-06-09 NOTE — Telephone Encounter (Signed)
Pt seen in office today with complaints of dizziness. Pt calling stating he is still experiencing dizziness and wants to know how long he should be experiencing dizziness and what to do about it. Pt would like a return call with recommendations.

## 2017-06-09 NOTE — Telephone Encounter (Signed)
Spoke with patient reviewed information and instructions. Patient verbalized understanding. 

## 2017-06-09 NOTE — Progress Notes (Signed)
Caleb LackJustin G Minnis , 01/03/1981, 36 y.o., male MRN: [id number] Patient Care Team    Relationship Specialty Notifications Start End  McGowen, Maryjean MornPhilip H, MD PCP - General Family Medicine  03/16/17     Chief Complaint  Patient presents with  . Ear Pain    right ear that started yesteday  . Sore Throat    pts also complain of sorethroat that is progressive but denies any history of cough  . Dizziness     Subjective: Pt presents for an OV with complaints of ear ache of 2d duration.  Associated symptoms include sore throat that started this morning. Feeling dizzy with quick head movements. He has not checked his temperature but endorses chills and frontal headache pressure and pain. He denies nausea, vomit or diarrhea. He denies rash. He has been putting an over-the-counter ear drop in his right ear for swimmer's ear. He has taken ibuprofen and Tylenol as needed. He has not been exposed to any sick contacts he is aware of, but does have small children. He has had his flu shot this year.  Depression screen [facility] 2/9 03/16/2017  Decreased Interest 0  Down, Depressed, Hopeless 0  PHQ - 2 Score 0    Allergies  Allergen Reactions  . Compazine [Prochlorperazine Edisylate] Anaphylaxis   Social History   Tobacco Use  . Smoking status: Former Smoker    Types: Cigarettes    Last attempt to quit: 02/21/2010    Years since quitting: 7.3  . Smokeless tobacco: Never Used  Substance Use Topics  . Alcohol use: Yes    Alcohol/week: 1.8 - 2.4 oz    Types: 3 - 4 Cans of beer per week    Comment: weekly   Past Medical History:  Diagnosis Date  . GERD (gastroesophageal reflux disease)   . Headache    occassionally  . History of kidney stones   . IBS (irritable bowel syndrome)    Past Surgical History:  Procedure Laterality Date  . LEG SURGERY Right   . MULTIPLE TOOTH EXTRACTIONS    . THYROGLOSSAL DUCT CYST Left 03/16/2015   Procedure: Excision Left neck cyst;  Surgeon: Christia Readingwight Bates, MD;   [facility];  Service: ENT;  Laterality: Left;   Family History  Problem Relation Age of Onset  . Hyperlipidemia Father   . Hypertension Father   . Stroke Neg Hx   . Diabetes Neg Hx   . Cancer Neg Hx    Allergies as of 06/09/2017      Reactions   Compazine [prochlorperazine Edisylate] Anaphylaxis      Medication List        Accurate as of 06/09/17 10:46 AM. Always use your most recent med list.          amitriptyline 10 MG tablet Commonly known as:  ELAVIL 1-2 tabs po qd for IBS   amoxicillin-clavulanate 875-125 MG tablet Commonly known as:  AUGMENTIN Take 1 tablet by mouth 2 (two) times daily.   b complex vitamins tablet Take 1 tablet by mouth daily.   pantoprazole 40 MG tablet Commonly known as:  PROTONIX TAKE 1 TABLET BY MOUTH EVERY DAY       All past medical history, surgical history, allergies, family history, immunizations andmedications were updated in the EMR today and reviewed under the history and medication portions of their EMR.     ROS: Negative, with the exception of above mentioned in HPI   Objective:  BP 118/72 (BP Location: Right Arm, Patient  Position: Sitting, Cuff Size: Large)   Pulse (!) 116   Temp 99.2 F (37.3 C) (Oral)   Wt 250 lb (113.4 kg)   SpO2 96%   BMI 36.13 kg/m  Body mass index is 36.13 kg/m. Gen: Afebrile. No acute distress. Nontoxic in appearance, well developed, well nourished. Congested sounding, pleasant obese Caucasian male. HENT: AT. Fontanet. Bilateral TM visualized with the fullness bilaterally, no bulging. Right external auditory canal with mild erythema, but without swelling, drainage or exudative material. MMM, no oral lesions. Bilateral nares with erythema, swelling and drainage. Throat with erythema, swelling present. No exudates. No cough. No hoarseness. Tender to palpation frontal sinus. Eyes:Pupils Equal Round Reactive to light, Extraocular movements intact,  Conjunctiva without redness, discharge or  icterus. Neck/lymp/endocrine: Supple, no lymphadenopathy CV: Mild tachycardia, no edema Chest: CTAB, no wheeze or crackles. Good air movement, normal resp effort.  Abd: Soft. NTND. BS present.  Skin: no rashes, purpura or petechiae.  Neuro:  Normal gait. PERLA. EOMi. Alert. Oriented x3  No exam data present No results found. Results for orders placed or performed in visit on 06/09/17 (from the past 24 hour(s))  POCT rapid strep A     Status: Normal   Collection Time: 06/09/17  9:13 AM  Result Value Ref Range   Rapid Strep A Screen Negative Negative    Assessment/Plan: Caleb Norris is a 36 y.o. male present for OV for  Sore throat Acute frontal sinusitis, recurrence not specified - Patient with low-grade fever despite Tylenol use today during visit. Mild tachycardia. Signs and symptoms of frontal sinusitis on exam. Right external auditory canal auditory canal appears mildly red, sparing the tympanic membrane. It does not appear to be otitis externa. Looks more irritation and he endorses using peroxide and Q-tips in the ear over the last 2 days. As well as over-the-counter eardrops. - Will treat with Augmentin twice a day 10 days. - Mucinex encouraged. - Rest and hydrate, Tylenol/ibuprofen use as needed. - Flonase nasal spray. Discontinue Afrin if use past 3 days. - Work excuse provided for today. - POCT rapid strep A--> negative - Follow-up 1-2 weeks if worsening or not improved.   Reviewed expectations re: course of current medical issues.  Discussed self-management of symptoms.  Outlined signs and symptoms indicating need for more acute intervention.  Patient verbalized understanding and all questions were answered.  Patient received an After-Visit Summary.    Orders Placed This Encounter  Procedures  . POCT rapid strep A     Note is dictated utilizing voice recognition software. Although note has been proof read prior to signing, occasional typographical errors  still can be missed. If any questions arise, please do not hesitate to call for verification.   electronically signed by:  Felix Pacinienee Dearius Hoffmann, DO  [facility] - OR

## 2017-06-09 NOTE — Telephone Encounter (Signed)
Dizziness is related to either fluid behind inner ear or frontal sinus infection (or both). The dizziness will likely improve with flonase use (helps the ears and sinus), use of abx to cure infection and HYDRATION.  - At the time of appt dizziness was reported with quick head movements only, if worsening could try oral steroids added to regimen to try to decrease fluid quicker. I have called that in just in case he would like to start. One a day for 5 days.

## 2017-06-09 NOTE — Patient Instructions (Addendum)
Rest, hydrate.  + flonase, mucinex (DM if cough), nettie pot or nasal saline.  Augmentin prescribed, take until completed.  If cough present it can last up to 6-8 weeks.  F/U 2 weeks of not improved.     Sinusitis, Adult Sinusitis is soreness and inflammation of your sinuses. Sinuses are hollow spaces in the bones around your face. Your sinuses are located:  Around your eyes.  In the middle of your forehead.  Behind your nose.  In your cheekbones.  Your sinuses and nasal passages are lined with a stringy fluid (mucus). Mucus normally drains out of your sinuses. When your nasal tissues become inflamed or swollen, the mucus can become trapped or blocked so air cannot flow through your sinuses. This allows bacteria, viruses, and funguses to grow, which leads to infection. Sinusitis can develop quickly and last for 7?10 days (acute) or for more than 12 weeks (chronic). Sinusitis often develops after a cold. What are the causes? This condition is caused by anything that creates swelling in the sinuses or stops mucus from draining, including:  Allergies.  Asthma.  Bacterial or viral infection.  Abnormally shaped bones between the nasal passages.  Nasal growths that contain mucus (nasal polyps).  Narrow sinus openings.  Pollutants, such as chemicals or irritants in the air.  A foreign object stuck in the nose.  A fungal infection. This is rare.  What increases the risk? The following factors may make you more likely to develop this condition:  Having allergies or asthma.  Having had a recent cold or respiratory tract infection.  Having structural deformities or blockages in your nose or sinuses.  Having a weak immune system.  Doing a lot of swimming or diving.  Overusing nasal sprays.  Smoking.  What are the signs or symptoms? The main symptoms of this condition are pain and a feeling of pressure around the affected sinuses. Other symptoms include:  Upper  toothache.  Earache.  Headache.  Bad breath.  Decreased sense of smell and taste.  A cough that may get worse at night.  Fatigue.  Fever.  Thick drainage from your nose. The drainage is often green and it may contain pus (purulent).  Stuffy nose or congestion.  Postnasal drip. This is when extra mucus collects in the throat or back of the nose.  Swelling and warmth over the affected sinuses.  Sore throat.  Sensitivity to light.  How is this diagnosed? This condition is diagnosed based on symptoms, a medical history, and a physical exam. To find out if your condition is acute or chronic, your health care provider may:  Look in your nose for signs of nasal polyps.  Tap over the affected sinus to check for signs of infection.  View the inside of your sinuses using an imaging device that has a light attached (endoscope).  If your health care provider suspects that you have chronic sinusitis, you may also:  Be tested for allergies.  Have a sample of mucus taken from your nose (nasal culture) and checked for bacteria.  Have a mucus sample examined to see if your sinusitis is related to an allergy.  If your sinusitis does not respond to treatment and it lasts longer than 8 weeks, you may have an MRI or CT scan to check your sinuses. These scans also help to determine how severe your infection is. In rare cases, a bone biopsy may be done to rule out more serious types of fungal sinus disease. How is this treated?  Treatment for sinusitis depends on the cause and whether your condition is chronic or acute. If a virus is causing your sinusitis, your symptoms will go away on their own within 10 days. You may be given medicines to relieve your symptoms, including:  Topical nasal decongestants. They shrink swollen nasal passages and let mucus drain from your sinuses.  Antihistamines. These drugs block inflammation that is triggered by allergies. This can help to ease swelling in  your nose and sinuses.  Topical nasal corticosteroids. These are nasal sprays that ease inflammation and swelling in your nose and sinuses.  Nasal saline washes. These rinses can help to get rid of thick mucus in your nose.  If your condition is caused by bacteria, you will be given an antibiotic medicine. If your condition is caused by a fungus, you will be given an antifungal medicine. Surgery may be needed to correct underlying conditions, such as narrow nasal passages. Surgery may also be needed to remove polyps. Follow these instructions at home: Medicines  Take, use, or apply over-the-counter and prescription medicines only as told by your health care provider. These may include nasal sprays.  If you were prescribed an antibiotic medicine, take it as told by your health care provider. Do not stop taking the antibiotic even if you start to feel better. Hydrate and Humidify  Drink enough water to keep your urine clear or pale yellow. Staying hydrated will help to thin your mucus.  Use a cool mist humidifier to keep the humidity level in your home above 50%.  Inhale steam for 10-15 minutes, 3-4 times a day or as told by your health care provider. You can do this in the bathroom while a hot shower is running.  Limit your exposure to cool or dry air. Rest  Rest as much as possible.  Sleep with your head raised (elevated).  Make sure to get enough sleep each night. General instructions  Apply a warm, moist washcloth to your face 3-4 times a day or as told by your health care provider. This will help with discomfort.  Wash your hands often with soap and water to reduce your exposure to viruses and other germs. If soap and water are not available, use hand sanitizer.  Do not smoke. Avoid being around people who are smoking (secondhand smoke).  Keep all follow-up visits as told by your health care provider. This is important. Contact a health care provider if:  You have a  fever.  Your symptoms get worse.  Your symptoms do not improve within 10 days. Get help right away if:  You have a severe headache.  You have persistent vomiting.  You have pain or swelling around your face or eyes.  You have vision problems.  You develop confusion.  Your neck is stiff.  You have trouble breathing. This information is not intended to replace advice given to you by your health care provider. Make sure you discuss any questions you have with your health care provider. Document Released: 06/09/2005 Document Revised: 02/03/2016 Document Reviewed: 04/04/2015 Elsevier Interactive Patient Education  2017 [employer]

## 2017-06-14 NOTE — Progress Notes (Signed)
Office Note 06/15/2017  CC:  Chief Complaint  Patient presents with  . Annual Exam    HPI:  Caleb Norris is a 36 y.o. male who is here for annual health maintenance exam.  No exercise at this time. No purposeful dieting at this time.  No acute complaints. Recent sinus infection--signif improved with augmentin.  Past Medical History:  Diagnosis Date  . GERD (gastroesophageal reflux disease)   . Headache    occassionally  . History of kidney stones   . IBS (irritable bowel syndrome)     Past Surgical History:  Procedure Laterality Date  . LEG SURGERY Right   . MULTIPLE TOOTH EXTRACTIONS    . THYROGLOSSAL DUCT CYST Left 03/16/2015   Procedure: Excision Left neck cyst;  Surgeon: Christia Readingwight Bates, MD;  [facility];  Service: ENT;  Laterality: Left;    Family History  Problem Relation Age of Onset  . Hyperlipidemia Father   . Hypertension Father   . Stroke Neg Hx   . Diabetes Neg Hx   . Cancer Neg Hx     Social History   Socioeconomic History  . Marital status: Married    Spouse name: Not on file  . Number of children: Not on file  . Years of education: Not on file  . Highest education level: Not on file  Social Needs  . Financial resource strain: Not on file  . Food insecurity - worry: Not on file  . Food insecurity - inability: Not on file  . Transportation needs - medical: Not on file  . Transportation needs - non-medical: Not on file  Occupational History  . Not on file  Tobacco Use  . Smoking status: Former Smoker    Types: Cigarettes    Last attempt to quit: 02/21/2010    Years since quitting: 7.3  . Smokeless tobacco: Never Used  Substance and Sexual Activity  . Alcohol use: Yes    Alcohol/week: 1.8 - 2.4 oz    Types: 3 - 4 Cans of beer per week    Comment: weekly  . Drug use: No  . Sexual activity: Not on file  Other Topics Concern  . Not on file  Social History Narrative   Married, 1 son and 1 daughter.   Educ: Associates degree.    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   Tob: former, 15 pack-yr hx.  Quit 2011.   Alcohol: occasional.   Drugs: none.   MEDS: he is not taking any meds at this time except augmentin for recent sinus infection. Outpatient Medications Prior to Visit  Medication Sig Dispense Refill  . amitriptyline (ELAVIL) 10 MG tablet 1-2 tabs po qd for IBS (Patient not taking: Reported on 06/15/2017) 180 tablet 3  . amoxicillin-clavulanate (AUGMENTIN) 875-125 MG tablet Take 1 tablet by mouth 2 (two) times daily. (Patient not taking: Reported on 06/15/2017) 20 tablet 0  . b complex vitamins tablet Take 1 tablet by mouth daily.    . pantoprazole (PROTONIX) 40 MG tablet TAKE 1 TABLET BY MOUTH EVERY DAY (Patient not taking: Reported on 06/15/2017) 30 tablet 5  . predniSONE (DELTASONE) 50 MG tablet Take 1 tablet (50 mg total) by mouth daily with breakfast. (Patient not taking: Reported on 06/15/2017) 5 tablet 0   Facility-Administered Medications Prior to Visit  Medication Dose Route Frequency Provider Last Rate Last Dose  . iohexol (OMNIPAQUE) 300 MG/ML solution 75 mL  75 mL Intravenous Once PRN Radiologist, Medication, MD  Allergies  Allergen Reactions  . Compazine [Prochlorperazine Edisylate] Anaphylaxis    ROS Review of Systems  Constitutional: Negative for appetite change, chills, fatigue and fever.  HENT: Negative for congestion, dental problem, ear pain and sore throat.   Eyes: Negative for discharge, redness and visual disturbance.  Respiratory: Negative for cough, chest tightness, shortness of breath and wheezing.   Cardiovascular: Negative for chest pain, palpitations and leg swelling.  Gastrointestinal: Negative for abdominal pain, blood in stool, diarrhea, nausea and vomiting.  Genitourinary: Negative for difficulty urinating, dysuria, flank pain, frequency, hematuria and urgency.  Musculoskeletal: Negative for arthralgias, back pain, joint swelling, myalgias and neck  stiffness.  Skin: Negative for pallor and rash.  Neurological: Negative for dizziness, speech difficulty, weakness and headaches.  Hematological: Negative for adenopathy. Does not bruise/bleed easily.  Psychiatric/Behavioral: Negative for confusion and sleep disturbance. The patient is not nervous/anxious.     PE; Blood pressure 123/83, pulse 84, temperature 97.7 F (36.5 C), temperature source Oral, resp. rate 16, height 5\' 10"  (1.778 m), weight 238 lb (108 kg), SpO2 97 %. Body mass index is 34.15 kg/m.  Gen: Alert, well appearing.  Patient is oriented to person, place, time, and situation. AFFECT: pleasant, lucid thought and speech. ENT: Ears: EACs clear, normal epithelium.  TMs with good light reflex and landmarks bilaterally.  Eyes: no injection, icteris, swelling, or exudate.  EOMI, PERRLA. Nose: no drainage or turbinate edema/swelling.  No injection or focal lesion.  Mouth: lips without lesion/swelling.  Oral mucosa pink and moist.  Dentition intact and without obvious caries or gingival swelling.  Oropharynx without erythema, exudate, or swelling.  Neck: supple/nontender.  No LAD, mass, or TM.  Carotid pulses 2+ bilaterally, without bruits. CV: RRR, no m/r/g.   LUNGS: CTA bilat, nonlabored resps, good aeration in all lung fields. ABD: soft, NT, ND, BS normal.  No hepatospenomegaly or mass.  No bruits. EXT: no clubbing, cyanosis, or edema.  Musculoskeletal: no joint swelling, erythema, warmth, or tenderness.  ROM of all joints intact. Skin - no sores or suspicious lesions or rashes or color changes   Pertinent labs:  No results found for: TSH Lab Results  Component Value Date   WBC 10.3 03/12/2015   HGB 13.4 03/12/2015   HCT 41.1 03/12/2015   MCV 84.6 03/12/2015   PLT 330 03/12/2015   Lab Results  Component Value Date   CREATININE 0.98 03/12/2015   BUN 11 03/12/2015   NA 139 03/12/2015   K 3.8 03/12/2015   CL 104 03/12/2015   CO2 22 03/12/2015   No results found for:  ALT, AST, GGT, ALKPHOS, BILITOT No results found for: CHOL No results found for: HDL No results found for: LDLCALC No results found for: TRIG No results found for: CHOLHDL No results found for: PSA   ASSESSMENT AND PLAN:   Health maintenance exam: Reviewed age and gender appropriate health maintenance issues (prudent diet, regular exercise, health risks of tobacco and excessive alcohol, use of seatbelts, fire alarms in home, use of sunscreen).  Also reviewed age and gender appropriate health screening as well as vaccine recommendations. Vaccines: Tdap and flu UTD. Labs: fasting HP.  An After Visit Summary was printed and given to the patient.  FOLLOW UP:  Return in about 1 year (around 06/15/2018) for annual CPE (fasting).   Signed:  Santiago BumpersPhil McGowen, MD           06/15/2017

## 2017-06-15 ENCOUNTER — Ambulatory Visit (INDEPENDENT_AMBULATORY_CARE_PROVIDER_SITE_OTHER): Payer: BLUE CROSS/BLUE SHIELD | Admitting: Family Medicine

## 2017-06-15 ENCOUNTER — Encounter: Payer: Self-pay | Admitting: Family Medicine

## 2017-06-15 VITALS — BP 123/83 | HR 84 | Temp 97.7°F | Resp 16 | Ht 70.0 in | Wt 238.0 lb

## 2017-06-15 DIAGNOSIS — Z0000 Encounter for general adult medical examination without abnormal findings: Secondary | ICD-10-CM

## 2017-06-15 NOTE — Patient Instructions (Signed)

## 2017-06-16 ENCOUNTER — Encounter: Payer: Self-pay | Admitting: Family Medicine

## 2017-06-16 LAB — COMPREHENSIVE METABOLIC PANEL
AG Ratio: 1.2 (calc) (ref 1.0–2.5)
ALT: 49 U/L — ABNORMAL HIGH (ref 9–46)
AST: 21 U/L (ref 10–40)
Albumin: 3.9 g/dL (ref 3.6–5.1)
Alkaline phosphatase (APISO): 69 U/L (ref 40–115)
BUN: 12 mg/dL (ref 7–25)
CO2: 25 mmol/L (ref 20–32)
Calcium: 9.1 mg/dL (ref 8.6–10.3)
Chloride: 105 mmol/L (ref 98–110)
Creat: 0.72 mg/dL (ref 0.60–1.35)
Globulin: 3.2 g/dL (calc) (ref 1.9–3.7)
Glucose, Bld: 99 mg/dL (ref 65–99)
Potassium: 4.4 mmol/L (ref 3.5–5.3)
Sodium: 139 mmol/L (ref 135–146)
Total Bilirubin: 0.4 mg/dL (ref 0.2–1.2)
Total Protein: 7.1 g/dL (ref 6.1–8.1)

## 2017-06-16 LAB — CBC WITH DIFFERENTIAL/PLATELET
Basophils Absolute: 39 cells/uL (ref 0–200)
Basophils Relative: 0.4 %
Eosinophils Absolute: 78 cells/uL (ref 15–500)
Eosinophils Relative: 0.8 %
HCT: 40.7 % (ref 38.5–50.0)
Hemoglobin: 13.2 g/dL (ref 13.2–17.1)
Lymphs Abs: 3793 cells/uL (ref 850–3900)
MCH: 26.8 pg — ABNORMAL LOW (ref 27.0–33.0)
MCHC: 32.4 g/dL (ref 32.0–36.0)
MCV: 82.6 fL (ref 80.0–100.0)
MPV: 11.4 fL (ref 7.5–12.5)
Monocytes Relative: 4.7 %
Neutro Abs: 5429 cells/uL (ref 1500–7800)
Neutrophils Relative %: 55.4 %
Platelets: 323 10*3/uL (ref 140–400)
RBC: 4.93 10*6/uL (ref 4.20–5.80)
RDW: 12.5 % (ref 11.0–15.0)
Total Lymphocyte: 38.7 %
WBC mixed population: 461 cells/uL (ref 200–950)
WBC: 9.8 10*3/uL (ref 3.8–10.8)

## 2017-06-16 LAB — LIPID PANEL
Cholesterol: 152 mg/dL (ref <200)
HDL: 27 mg/dL — ABNORMAL LOW (ref >40)
LDL Cholesterol (Calc): 94 mg/dL (calc)
Non-HDL Cholesterol (Calc): 125 mg/dL (calc) (ref <130)
Total CHOL/HDL Ratio: 5.6 (calc) — ABNORMAL HIGH (ref <5.0)
Triglycerides: 221 mg/dL — ABNORMAL HIGH (ref <150)

## 2017-06-16 LAB — TSH: TSH: 0.66 mIU/L (ref 0.40–4.50)

## 2017-06-18 ENCOUNTER — Encounter: Payer: Self-pay | Admitting: *Deleted

## 2017-09-30 ENCOUNTER — Other Ambulatory Visit: Payer: Self-pay

## 2017-09-30 MED ORDER — PANTOPRAZOLE SODIUM 40 MG PO TBEC: 40.0000 mg | DELAYED_RELEASE_TABLET | Freq: Every day | ORAL | 5 refills | Status: DC

## 2017-11-01 ENCOUNTER — Other Ambulatory Visit: Payer: Self-pay | Admitting: Family Medicine

## 2017-11-02 NOTE — Telephone Encounter (Signed)
[facility]  RF request for amitriptyline LOV: 06/15/17 Next ov: None Last written: 03/16/17 #180 w/ 3RF  Please advise. Thanks.

## 2017-12-13 DIAGNOSIS — H00014 Hordeolum externum left upper eyelid: Secondary | ICD-10-CM | POA: Diagnosis not present

## 2018-01-06 DIAGNOSIS — H66001 Acute suppurative otitis media without spontaneous rupture of ear drum, right ear: Secondary | ICD-10-CM | POA: Diagnosis not present

## 2018-03-30 ENCOUNTER — Other Ambulatory Visit: Payer: Self-pay | Admitting: Family Medicine

## 2018-05-14 DIAGNOSIS — H66003 Acute suppurative otitis media without spontaneous rupture of ear drum, bilateral: Secondary | ICD-10-CM | POA: Diagnosis not present

## 2018-06-26 ENCOUNTER — Ambulatory Visit: Payer: BLUE CROSS/BLUE SHIELD | Admitting: Family Medicine

## 2018-06-26 ENCOUNTER — Encounter: Payer: Self-pay | Admitting: Family Medicine

## 2018-06-26 VITALS — BP 120/80 | HR 93 | Temp 98.0°F | Resp 14 | Ht 70.0 in | Wt 263.0 lb

## 2018-06-26 DIAGNOSIS — H66006 Acute suppurative otitis media without spontaneous rupture of ear drum, recurrent, bilateral: Secondary | ICD-10-CM | POA: Diagnosis not present

## 2018-06-26 MED ORDER — AMOXICILLIN-POT CLAVULANATE 875-125 MG PO TABS: 1.0000 | ORAL_TABLET | Freq: Two times a day (BID) | ORAL | 0 refills | Status: DC

## 2018-06-26 NOTE — Patient Instructions (Addendum)
Please follow up in 2 weeks with Dr. Milinda CaveMcgowen.   Otitis Media, Adult  Otitis media occurs when there is inflammation and fluid in the middle ear. Your middle ear is a part of the ear that contains bones for hearing as well as air that helps send sounds to your brain. What are the causes? This condition is caused by a blockage in the eustachian tube. This tube drains fluid from the ear to the back of the nose (nasopharynx). A blockage in this tube can be caused by an object or by swelling (edema) in the tube. Problems that can cause a blockage include:  A cold or other upper respiratory infection.  Allergies.  An irritant, such as tobacco smoke.  Enlarged adenoids. The adenoids are areas of soft tissue located high in the back of the throat, behind the nose and the roof of the mouth.  A mass in the nasopharynx.  Damage to the ear caused by pressure changes (barotrauma). What are the signs or symptoms? Symptoms of this condition include:  Ear pain.  A fever.  Decreased hearing.  A headache.  Tiredness (lethargy).  Fluid leaking from the ear.  Ringing in the ear. How is this diagnosed? This condition is diagnosed with a physical exam. During the exam your health care provider will use an instrument called an otoscope to look into your ear and check for redness, swelling, and fluid. He or she will also ask about your symptoms. Your health care provider may also order tests, such as:  A test to check the movement of the eardrum (pneumatic otoscopy). This test is done by squeezing a small amount of air into the ear.  A test that changes air pressure in the middle ear to check how well the eardrum moves and whether the eustachian tube is working (tympanogram). How is this treated? This condition usually goes away on its own within 3-5 days. But if the condition is caused by a bacteria infection and does not go away own its own, or keeps coming back, your health care provider  may:  Prescribe antibiotic medicines to treat the infection.  Prescribe or recommend medicines to control pain. Follow these instructions at home:  Take over-the-counter and prescription medicines only as told by your health care provider.  If you were prescribed an antibiotic medicine, take it as told by your health care provider. Do not stop taking the antibiotic even if you start to feel better.  Keep all follow-up visits as told by your health care provider. This is important. Contact a health care provider if:  You have bleeding from your nose.  There is a lump on your neck.  You are not getting better in 5 days.  You feel worse instead of better. Get help right away if:  You have severe pain that is not controlled with medicine.  You have swelling, redness, or pain around your ear.  You have stiffness in your neck.  A part of your face is paralyzed.  The bone behind your ear (mastoid) is tender when you touch it.  You develop a severe headache. Summary  Otitis media is redness, soreness, and swelling of the middle ear.  This condition usually goes away on its own within 3-5 days.  If the problem does not go away in 3-5 days, your health care provider may prescribe or recommend medicines to treat your symptoms.  If you were prescribed an antibiotic medicine, take it as told by your health care provider. This  information is not intended to replace advice given to you by your health care provider. Make sure you discuss any questions you have with your health care provider. Document Released: 03/14/2004 Document Revised: 05/30/2016 Document Reviewed: 05/30/2016 Elsevier Interactive Patient Education  2019 [employer].

## 2018-06-26 NOTE — Progress Notes (Signed)
Subjective  CC:  Chief Complaint  Patient presents with  . Ear Pain    bilateral; itchy and scratchy throat     HPI: Caleb Norris is a 38 y.o. male who presents to the office today to address the problems listed above in the chief complaint.  38 year old male reports 1 week history of bilateral ear pain.  Started off with itching but then had pressure and sharp pain in both ears.  Started with scratchy throat but no other URI symptoms.  No ear trauma.  Reports that over the last 6 months has been treated twice for bilateral otitis media at urgent cares.  As a child he had tympanostomy tubes due to recurrent ear infections but had not been bothered by problems since.  He is using over-the-counter pain medicines with mild improvement in pain.  No cough, shortness of breath, sore throat, sinus pain or pressure.  He reports after taking the antibiotics both times his ear pain resolved but he did not have any follow-up. Assessment  1. Recurrent acute suppurative otitis media without spontaneous rupture of tympanic membrane of both sides      Plan   Recurrent otitis media versus chronic abnormality: Treat with Augmentin for 1 week.  Then recommend recheck ears at PCPs office in 10 days.  If remain abnormal, recommend ENT referral.  If further episodes, may need further work-up as well.  Patient understands and agrees with care plan.  Over-the-counter pain meds as needed.  Follow up: Return if symptoms worsen or fail to improve.  Visit date not found  No orders of the defined types were placed in this encounter.  Meds ordered this encounter  Medications  . amoxicillin-clavulanate (AUGMENTIN) 875-125 MG tablet    Sig: Take 1 tablet by mouth 2 (two) times daily.    Dispense:  14 tablet    Refill:  0      I reviewed the patients updated PMH, FH, and SocHx.    Patient Active Problem List   Diagnosis Date Noted  . 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  amitriptyline (ELAVIL) 10 MG tablet TAKE 1 TO 2 TABLETS BY MOUTH EVERY DAY FOR IBS  . b complex vitamins tablet Take 1 tablet by mouth daily.  . pantoprazole (PROTONIX) 40 MG tablet TAKE 1 TABLET BY MOUTH EVERY DAY  . [DISCONTINUED] amoxicillin-clavulanate (AUGMENTIN) 875-125 MG tablet Take 1 tablet by mouth 2 (two) times daily.  . [DISCONTINUED] predniSONE (DELTASONE) 50 MG tablet Take 1 tablet (50 mg total) by mouth daily with breakfast.    Allergies: Patient is allergic to compazine [prochlorperazine edisylate]. Family History: Patient family history includes Hyperlipidemia in his father; Hypertension in his father. Social History:  Patient  reports that he quit smoking about 8 years ago. His smoking use included cigarettes. He has never used smokeless tobacco. He reports current alcohol use of about 3.0 - 4.0 standard drinks of alcohol per week. He reports that he does not use drugs.  Review of Systems: Constitutional: Negative for fever malaise or anorexia Cardiovascular: negative for chest pain Respiratory: negative for SOB or persistent cough Gastrointestinal: negative for abdominal pain  Objective  Vitals: BP 120/80 (BP Location: Left Arm, Patient Position: Sitting, Cuff Size: Large)   Pulse 93   Temp 98 F (36.7 C) (Oral)   Resp 14   Ht 5\' 10"  (1.778 m)   Wt 263 lb (119.3 kg)   SpO2 97%   BMI 37.74 kg/m  General: no acute distress , A&Ox3 HEENT: PEERL, conjunctiva normal, Oropharynx moist and clear,neck is supple, right TM is opaque, obstructing landmarks; left TM is erythematous with supportive effusion. Cardiovascular:  RRR without murmur or gallop.  Respiratory:  Good breath sounds bilaterally, CTAB with normal respiratory effort Skin:  Warm, no rashes     Commons side effects, risks, benefits, and alternatives for medications and treatment plan prescribed today were discussed, and the patient expressed understanding of the given instructions. Patient is instructed  to call or message via MyChart if he/she has any questions or concerns regarding our treatment plan. No barriers to understanding were identified. We discussed Red Flag symptoms and signs in detail. Patient expressed understanding regarding what to do in case of urgent or emergency type symptoms.   Medication list was reconciled, printed and provided to the patient in AVS. Patient instructions and summary information was reviewed with the patient as documented in the AVS. This note was prepared with assistance of Dragon voice recognition software. Occasional wrong-word or sound-a-like substitutions may have occurred due to the inherent limitations of voice recognition software

## 2018-06-29 ENCOUNTER — Encounter: Payer: Self-pay | Admitting: Family Medicine

## 2018-06-29 ENCOUNTER — Ambulatory Visit: Payer: BLUE CROSS/BLUE SHIELD | Admitting: Family Medicine

## 2018-06-29 VITALS — BP 140/91 | HR 105 | Temp 98.0°F | Wt 268.2 lb

## 2018-06-29 DIAGNOSIS — H60501 Unspecified acute noninfective otitis externa, right ear: Secondary | ICD-10-CM | POA: Diagnosis not present

## 2018-06-29 DIAGNOSIS — H65197 Other acute nonsuppurative otitis media recurrent, unspecified ear: Secondary | ICD-10-CM | POA: Diagnosis not present

## 2018-06-29 MED ORDER — CIPROFLOXACIN-DEXAMETHASONE 0.3-0.1 % OT SUSP: 4.0000 [drp] | Freq: Two times a day (BID) | OTIC | 0 refills | Status: AC

## 2018-06-29 NOTE — Progress Notes (Signed)
Caleb Norris , 06/26/1980, 38 y.o., male MRN: [id number] Patient Care Team    Relationship Specialty Notifications Start End  McGowen, Maryjean MornPhilip H, MD PCP - General Family Medicine  03/16/17     Chief Complaint  Patient presents with  . Otitis Media    Dx with double ear infection this weekend, left ear seems better, right ear is still painful, states it hurts to chew     Subjective: Pt presents for an OV with complaints of ear pain of 4 days duration.  Associated symptoms include recent diagnosis with otitis media at [facility] and prescribed Augmentin. He reports he has had 3 ear infections the last 12 months. He had many ear infections as a kid, required tube placement. He did not have ear infections after until this past year. He reports the last 4 days his right ear has become more painful, despite abx tx. He has been taking ibuprofen to help with the pain. He denies fever, chills, nausea or tinnitus.  He is taking zyrtec. He is not using flonase. He is using afrin occasionally.   Depression screen [facility] 03/16/2017  Decreased Interest 0  Down, Depressed, Hopeless 0  PHQ - 2 Score 0    Allergies  Allergen Reactions  . Compazine [Prochlorperazine Edisylate] Anaphylaxis   Social History   Tobacco Use  . Smoking status: Former Smoker    Types: Cigarettes    Last attempt to quit: 02/21/2010    Years since quitting: 8.3  . Smokeless tobacco: Never Used  Substance Use Topics  . Alcohol use: Yes    Alcohol/week: 3.0 - 4.0 standard drinks    Types: 3 - 4 Cans of beer per week    Comment: weekly   Past Medical History:  Diagnosis Date  . GERD (gastroesophageal reflux disease)   . Headache    occassionally  . History of kidney stones   . IBS (irritable bowel syndrome)   . Metabolic syndrome 2018   Trigs, HDL, waist circ  . Obesity, Class I, BMI 30-34.9    Past Surgical History:  Procedure Laterality Date  . LEG SURGERY Right   . MULTIPLE TOOTH EXTRACTIONS    .  THYROGLOSSAL DUCT CYST Left 03/16/2015   Procedure: Excision Left neck cyst;  Surgeon: Christia Readingwight Bates, MD;  [facility];  Service: ENT;  Laterality: Left;   Family History  Problem Relation Age of Onset  . Hyperlipidemia Father   . Hypertension Father   . Stroke Neg Hx   . Diabetes Neg Hx   . Cancer Neg Hx    Allergies as of 06/29/2018      Reactions   Compazine [prochlorperazine Edisylate] Anaphylaxis      Medication List       Accurate as of June 29, 2018  1:28 PM. Always use your most recent med list.        amitriptyline 10 MG tablet Commonly known as:  ELAVIL TAKE 1 TO 2 TABLETS BY MOUTH EVERY DAY FOR IBS   amoxicillin-clavulanate 875-125 MG tablet Commonly known as:  AUGMENTIN Take 1 tablet by mouth 2 (two) times daily.   b complex vitamins tablet Take 1 tablet by mouth daily.   pantoprazole 40 MG tablet Commonly known as:  PROTONIX TAKE 1 TABLET BY MOUTH EVERY DAY       All past medical history, surgical history, allergies, family history, immunizations andmedications were updated in the EMR today and reviewed under the history and medication portions of  their EMR.     ROS: Negative, with the exception of above mentioned in HPI   Objective:  BP (!) 140/91 (BP Location: Left Arm, Patient Position: Sitting, Cuff Size: Large)   Pulse (!) 105   Temp 98 F (36.7 C) (Oral)   Wt 268 lb 3.2 oz (121.7 kg)   SpO2 97%   BMI 38.48 kg/m  Body mass index is 38.48 kg/m. Gen: Afebrile. No acute distress. Nontoxic in appearance, well developed, well nourished.  HENT: AT. [place]. Bilateral TM visualized left TM with mild erythema top 50% and small amount of wht exudate. Left TM with erythema of TM and EAC with moderate wht exudate covering distal 1/3 of canal and TM. MMM, no oral lesions. Bilateral nares without erythema or drianage. Throat without erythema or exudates. No cough or hoarseness.  Eyes:Pupils Equal Round Reactive to light, Extraocular movements intact,   Conjunctiva without redness, discharge or icterus. Neck/lymp/endocrine: Supple,no lymphadenopathy CV: RRR  Chest: CTAB, no wheeze or crackles. Good air movement, normal resp effort.   No exam data present No results found. No results found for this or any previous visit (from the past 24 hour(s)).  Assessment/Plan: Caleb Norris is a 38 y.o. male present for OV for  Acute otitis externa of right ear, unspecified type/Other recurrent acute nonsuppurative otitis media, unspecified laterality - Discussed recurrent ear infections every couple months- UC x2 and [facility]. Referred to ENT for evaluation and recs since he had ear infections as a kid- but not as adult until this past ear. He does report feeling they have resolved between infections- infections were always treated with Augmentin per patient.  - Finish Augmentin prescribed at clinic.  - start  ciprodex drops 4 gtt BID bilateral ears.  - Ambulatory referral to ENT - NSAIDS for comfort - continue zyrtec daily, start flonase daily. Stop afrin.  - F/U with ENT if appt is able to be made soon enough or with PCP in 1-2 weeks.    Reviewed expectations re: course of current medical issues.  Discussed self-management of symptoms.  Outlined signs and symptoms indicating need for more acute intervention.  Patient verbalized understanding and all questions were answered.  Patient received an After-Visit Summary.    No orders of the defined types were placed in this encounter.    Note is dictated utilizing voice recognition software. Although note has been proof read prior to signing, occasional typographical errors still can be missed. If any questions arise, please do not hesitate to call for verification.   electronically signed by:  Felix Pacini, DO  [facility] - OR

## 2018-06-29 NOTE — Patient Instructions (Signed)
Start drops as bottle indicates both ears for 7 days.  Continue oral antibiotics.  Use ibuprofen 600 mg every 8 hours for pain   Continue zyrtec or xyzal. Stop afrin and start flonase daily.   I referred you to ENT, they will call to schedule.    Otitis Externa  Otitis externa is an infection of the outer ear canal. The outer ear canal is the area between the outside of the ear and the eardrum. Otitis externa is sometimes called swimmer's ear. What are the causes? Common causes of this condition include:  Swimming in dirty water.  Moisture in the ear.  An injury to the inside of the ear.  An object stuck in the ear.  A cut or scrape on the outside of the ear. What increases the risk? You are more likely to get this condition if you go swimming often. What are the signs or symptoms?  Itching in the ear. This is often the first symptom.  Swelling of the ear.  Redness in the ear.  Ear pain. The pain may get worse when you pull on your ear.  Pus coming from the ear. How is this treated? This condition may be treated with:  Antibiotic ear drops. These are often given for 10-14 days.  Medicines to reduce itching and swelling. Follow these instructions at home:  If you were given antibiotic ear drops, use them as told by your doctor. Do not stop using them even if your condition gets better.  Take over-the-counter and prescription medicines only as told by your doctor.  Avoid getting water in your ears as told by your doctor. You may be told to avoid swimming or water sports for a few days.  Keep all follow-up visits as told by your doctor. This is important. How is this prevented?  Keep your ears dry. Use the corner of a towel to dry your ears after you swim or bathe.  Try not to scratch or put things in your ear. Doing these things makes it easier for germs to grow in your ear.  Avoid swimming in lakes, dirty water, or pools that may not have the right amount of a  chemical called chlorine. Contact a doctor if:  You have a fever.  Your ear is still red, swollen, or painful after 3 days.  You still have pus coming from your ear after 3 days.  Your redness, swelling, or pain gets worse.  You have a really bad headache.  You have redness, swelling, pain, or tenderness behind your ear. Summary  Otitis externa is an infection of the outer ear canal.  Symptoms include pain, redness, and swelling of the ear.  If you were given antibiotic ear drops, use them as told by your doctor. Do not stop using them even if your condition gets better.  Try not to scratch or put things in your ear. This information is not intended to replace advice given to you by your health care provider. Make sure you discuss any questions you have with your health care provider. Document Released: 11/26/2007 Document Revised: 11/13/2017 Document Reviewed: 11/13/2017 Elsevier Interactive Patient Education  2019 [employer].

## 2018-07-05 ENCOUNTER — Encounter: Payer: Self-pay | Admitting: Family Medicine

## 2018-07-07 ENCOUNTER — Ambulatory Visit: Payer: BLUE CROSS/BLUE SHIELD | Admitting: Family Medicine

## 2018-07-07 ENCOUNTER — Encounter: Payer: Self-pay | Admitting: Family Medicine

## 2018-07-07 VITALS — BP 118/81 | HR 107 | Temp 98.8°F | Resp 16 | Ht 70.0 in | Wt 266.0 lb

## 2018-07-07 DIAGNOSIS — H6693 Otitis media, unspecified, bilateral: Secondary | ICD-10-CM

## 2018-07-07 NOTE — Progress Notes (Addendum)
OFFICE VISIT  07/07/2018   CC:  Chief Complaint  Patient presents with  . Follow-up    ear infection   HPI:    Patient is a 38 y.o.  male who presents for f/u bilat ear infection. Dr. Claiborne BillingsKuneff saw him 8 d/a, not long after he had been dx'd at Sat clinic with AOM. Plan as of the end of Dr. Alan RipperKuneff's visit was: Finish Augmentin prescribed at recent Saturday clinic.  - start  ciprodex drops 4 gtt BID bilateral ears.  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 - NSAIDS for comfort. - continue zyrtec daily, start flonase daily. Stop afrin.  - F/U with ENT if appt is able to be made soon enough or with PCP in 1-2 weeks.  Interim hx: Finished augmentin 5 d/a.  He is about to finish the drops in both ears. Both ears feel completely back to normal now.    No pain, no hearing deficit, no ringing, no dizziness. This is similar to the last 3-4 infections that all occurred in the last 1 yr. ENT appt made but pt doesn't know date yet b/c info has been mailed to him--he needs to update phone number in system.  No recent significant URI sx's or allergy sx's.  Past Medical History:  Diagnosis Date  . GERD (gastroesophageal reflux disease)   . Headache    occassionally  . History of kidney stones   . IBS (irritable bowel syndrome)   . Metabolic syndrome 2018   Trigs, HDL, waist circ  . Obesity, Class I, BMI 30-34.9     Past Surgical History:  Procedure Laterality Date  . LEG SURGERY Right   . MULTIPLE TOOTH EXTRACTIONS    . THYROGLOSSAL DUCT CYST Left 03/16/2015   Procedure: Excision Left neck cyst;  Surgeon: Christia Readingwight Bates, MD;  [facility];  Service: ENT;  Laterality: Left;    Outpatient Medications Prior to Visit  Medication Sig Dispense Refill  . amitriptyline (ELAVIL) 10 MG tablet TAKE 1 TO 2 TABLETS BY MOUTH EVERY DAY FOR IBS 180 tablet 3  . b complex vitamins tablet Take 1 tablet by mouth daily.    . cetirizine (ZYRTEC) 10 MG tablet Take 10 mg by mouth  daily.    . Multiple Vitamin (MULTIVITAMIN) tablet Take 1 tablet by mouth daily.    . pantoprazole (PROTONIX) 40 MG tablet TAKE 1 TABLET BY MOUTH EVERY DAY 90 tablet 1  . amoxicillin-clavulanate (AUGMENTIN) 875-125 MG tablet Take 1 tablet by mouth 2 (two) times daily. (Patient not taking: Reported on 07/07/2018) 14 tablet 0   Facility-Administered Medications Prior to Visit  Medication Dose Route Frequency Provider Last Rate Last Dose  . iohexol (OMNIPAQUE) 300 MG/ML solution 75 mL  75 mL Intravenous Once PRN Radiologist, Medication, MD        Allergies  Allergen Reactions  . Compazine [Prochlorperazine Edisylate] Anaphylaxis    ROS As per HPI  PE: Blood pressure 118/81, pulse (!) 107, temperature 98.8 F (37.1 C), temperature source Temporal, resp. rate 16, height 5\' 10"  (1.778 m), weight 266 lb (120.7 kg), SpO2 98 %. Body mass index is 38.17 kg/m.  Gen: Alert, well appearing.  Patient is oriented to person, place, time, and situation. AFFECT: pleasant, lucid thought and speech. ENT: Ears: EACs clear, normal epithelium.  TMs without erythema or bulging.  TMs intact.  He has some calcification/scarring noted on each TM.  I cannot see any fluid in the middle ears.  Eyes: no injection, icteris, swelling, or exudate.  EOMI, PERRLA. Nose: no drainage or turbinate edema/swelling.  No injection or focal lesion.  Mouth: lips without lesion/swelling.  Oral mucosa pink and moist.  Dentition intact and without obvious caries or gingival swelling.  Oropharynx without erythema, exudate, or swelling.    LABS:    Chemistry      Component Value Date/Time   NA 139 06/15/2017 0855   K 4.4 06/15/2017 0855   CL 105 06/15/2017 0855   CO2 25 06/15/2017 0855   BUN 12 06/15/2017 0855   CREATININE 0.72 06/15/2017 0855      Component Value Date/Time   CALCIUM 9.1 06/15/2017 0855   AST 21 06/15/2017 0855   ALT 49 (H) 06/15/2017 0855   BILITOT 0.4 06/15/2017 0855      IMPRESSION AND  PLAN:  Bilat AOM, with AOE recently as well.  No more infection present. His AOM has been a recurrent problem about q2-3 mo over the last 1 yr or so. He has been set up to see Dr. Jenne Pane in ENT soon to get further eval for this problem.  Spent 15 min with pt today, with >50% of this time spent in counseling and care coordination regarding the above problems.  An After Visit Summary was printed and given to the patient.  FOLLOW UP: Return for as needed.  Signed:  Santiago Bumpers, MD           07/07/2018

## 2018-07-14 ENCOUNTER — Encounter: Payer: BLUE CROSS/BLUE SHIELD | Admitting: Family Medicine

## 2018-07-14 ENCOUNTER — Telehealth: Payer: Self-pay | Admitting: *Deleted

## 2018-07-14 NOTE — Telephone Encounter (Signed)
Please advise. Thanks.  

## 2018-07-14 NOTE — Telephone Encounter (Signed)
Copied from CRM [id number]. Topic: Appointment Scheduling - Transfer of Care >> Jul 14, 2018  8:05 AM Gaynelle Adu wrote: Pt is requesting to transfer FROM: [facility] Pt is requesting to transfer TO:  [facility] Reason for requested transfer: Pt state Dr.[place] is not available when they need appt's.   Send CRM to patient's current PCP (transferring FROM).  Patent requested a call back once completed

## 2018-07-14 NOTE — Telephone Encounter (Signed)
Ok with me. Needs to be in a new pt slot and fill out new pt packet to ensure all info is uptodate.

## 2018-07-14 NOTE — Telephone Encounter (Signed)
OK with me.

## 2018-07-23 NOTE — Telephone Encounter (Signed)
Please call pt to schedule apt with Dr. Claiborne Billings (30 min).

## 2018-07-26 NOTE — Telephone Encounter (Signed)
Called patient to set up appt with Dr. Claiborne Billings. 1st available made on 08/13/18 for early morning per patients request. Patient acknowledged.   Closed note.

## 2018-08-10 DIAGNOSIS — H900 Conductive hearing loss, bilateral: Secondary | ICD-10-CM | POA: Diagnosis not present

## 2018-08-10 DIAGNOSIS — Z7289 Other problems related to lifestyle: Secondary | ICD-10-CM | POA: Diagnosis not present

## 2018-08-10 DIAGNOSIS — H9203 Otalgia, bilateral: Secondary | ICD-10-CM | POA: Diagnosis not present

## 2018-08-10 DIAGNOSIS — H6983 Other specified disorders of Eustachian tube, bilateral: Secondary | ICD-10-CM | POA: Diagnosis not present

## 2018-08-10 DIAGNOSIS — Z87891 Personal history of nicotine dependence: Secondary | ICD-10-CM | POA: Diagnosis not present

## 2018-08-10 DIAGNOSIS — J343 Hypertrophy of nasal turbinates: Secondary | ICD-10-CM | POA: Diagnosis not present

## 2018-08-12 DIAGNOSIS — K589 Irritable bowel syndrome without diarrhea: Secondary | ICD-10-CM | POA: Insufficient documentation

## 2018-08-13 ENCOUNTER — Encounter: Payer: BLUE CROSS/BLUE SHIELD | Admitting: Family Medicine

## 2018-08-19 ENCOUNTER — Other Ambulatory Visit (HOSPITAL_BASED_OUTPATIENT_CLINIC_OR_DEPARTMENT_OTHER): Payer: Self-pay

## 2018-08-19 ENCOUNTER — Encounter: Payer: BLUE CROSS/BLUE SHIELD | Admitting: Family Medicine

## 2018-08-19 DIAGNOSIS — R0683 Snoring: Secondary | ICD-10-CM

## 2018-09-03 ENCOUNTER — Encounter: Payer: BLUE CROSS/BLUE SHIELD | Admitting: Family Medicine

## 2018-09-03 ENCOUNTER — Encounter (HOSPITAL_BASED_OUTPATIENT_CLINIC_OR_DEPARTMENT_OTHER): Payer: BLUE CROSS/BLUE SHIELD

## 2018-09-10 ENCOUNTER — Encounter (HOSPITAL_BASED_OUTPATIENT_CLINIC_OR_DEPARTMENT_OTHER): Payer: BLUE CROSS/BLUE SHIELD

## 2018-10-01 ENCOUNTER — Other Ambulatory Visit: Payer: Self-pay | Admitting: Family Medicine

## 2018-10-13 ENCOUNTER — Encounter (HOSPITAL_BASED_OUTPATIENT_CLINIC_OR_DEPARTMENT_OTHER): Payer: BLUE CROSS/BLUE SHIELD

## 2018-10-25 ENCOUNTER — Encounter (HOSPITAL_BASED_OUTPATIENT_CLINIC_OR_DEPARTMENT_OTHER): Payer: BLUE CROSS/BLUE SHIELD

## 2018-11-22 ENCOUNTER — Ambulatory Visit (HOSPITAL_BASED_OUTPATIENT_CLINIC_OR_DEPARTMENT_OTHER): Payer: BLUE CROSS/BLUE SHIELD | Admitting: Internal Medicine

## 2018-11-22 ENCOUNTER — Other Ambulatory Visit: Payer: Self-pay

## 2018-12-22 DIAGNOSIS — G4733 Obstructive sleep apnea (adult) (pediatric): Secondary | ICD-10-CM

## 2018-12-22 HISTORY — DX: Obstructive sleep apnea (adult) (pediatric): G47.33

## 2018-12-27 ENCOUNTER — Ambulatory Visit (HOSPITAL_BASED_OUTPATIENT_CLINIC_OR_DEPARTMENT_OTHER): Payer: BC Managed Care – PPO | Attending: Otolaryngology | Admitting: Internal Medicine

## 2018-12-27 ENCOUNTER — Other Ambulatory Visit: Payer: Self-pay

## 2018-12-27 VITALS — Ht 70.0 in | Wt 250.0 lb

## 2018-12-27 DIAGNOSIS — G4733 Obstructive sleep apnea (adult) (pediatric): Secondary | ICD-10-CM | POA: Diagnosis not present

## 2018-12-27 DIAGNOSIS — R0683 Snoring: Secondary | ICD-10-CM | POA: Diagnosis not present

## 2019-01-01 DIAGNOSIS — R0683 Snoring: Secondary | ICD-10-CM | POA: Diagnosis not present

## 2019-01-01 NOTE — Procedures (Signed)
    Patient Name: Caleb Norris, Caleb Norris Date: 12/28/2018 Gender: Male D.O.B: [birth year] Age (years): 38 Referring Provider: Melida Quitter Height (inches): 1 Interpreting Physician: Baird Lyons MD, ABSM Weight (lbs): 250 RPSGT: Jacolyn Reedy BMI: 36 MRN: [id number] Neck Size: 17.50  CLINICAL INFORMATION Sleep Study Type: HST Indication for sleep study: OSA Epworth Sleepiness Score: 13  SLEEP STUDY TECHNIQUE A multi-channel overnight portable sleep study was performed. The channels recorded were: nasal airflow, thoracic respiratory movement, and oxygen saturation with a pulse oximetry. Snoring was also monitored.  MEDICATIONS Patient self administered medications include: none reported.  SLEEP ARCHITECTURE Patient was studied for 387.3 minutes. The sleep efficiency was 100.0 % and the patient was supine for 13.2%. The arousal index was 0.0 per hour.  RESPIRATORY PARAMETERS The overall AHI was 16.7 per hour, with a central apnea index of 0.0 per hour. The oxygen nadir was 90% during sleep.  CARDIAC DATA Mean heart rate during sleep was 79.0 bpm.  IMPRESSIONS - Moderate obstructive sleep apnea occurred during this study (AHI = 16.7/h). - No significant central sleep apnea occurred during this study (CAI = 0.0/h). - The patient had minimal or no oxygen desaturation during the study (Min O2 = 90%) - Patient snored.  DIAGNOSIS - Obstructive Sleep Apnea (327.23 [G47.33 ICD-10])  RECOMMENDATIONS - Suggest CPAP autopap or CPAP titration sleep study. Other options would be based on clinical judgment. - Be careful with alcohol, sedatives and other CNS depressants that may worsen sleep apnea and disrupt normal sleep architecture. - Sleep hygiene should be reviewed to assess factors that may improve sleep quality. - Weight management and regular exercise should be initiated or continued.  [Electronically signed] 01/01/2019 11:29 AM  Baird Lyons MD, ABSM Diplomate,  American Board of Sleep Medicine   NPI: [id number]                         [facility] [place] [facility]  ELECTRONICALLY SIGNED ON:  01/01/2019, 11:28 AM [place] PH: [phone]   FX: [phone] [place]

## 2019-01-04 ENCOUNTER — Encounter: Payer: Self-pay | Admitting: Family Medicine

## 2019-02-04 ENCOUNTER — Ambulatory Visit: Payer: BC Managed Care – PPO | Admitting: Family Medicine

## 2019-02-04 ENCOUNTER — Other Ambulatory Visit: Payer: Self-pay

## 2019-02-04 ENCOUNTER — Telehealth: Payer: Self-pay | Admitting: Family Medicine

## 2019-02-04 ENCOUNTER — Encounter: Payer: Self-pay | Admitting: Family Medicine

## 2019-02-04 VITALS — BP 127/82 | HR 93 | Temp 97.7°F | Resp 17 | Ht 70.0 in | Wt 257.2 lb

## 2019-02-04 DIAGNOSIS — Z0000 Encounter for general adult medical examination without abnormal findings: Secondary | ICD-10-CM | POA: Diagnosis not present

## 2019-02-04 DIAGNOSIS — K219 Gastro-esophageal reflux disease without esophagitis: Secondary | ICD-10-CM

## 2019-02-04 DIAGNOSIS — Z114 Encounter for screening for human immunodeficiency virus [HIV]: Secondary | ICD-10-CM | POA: Diagnosis not present

## 2019-02-04 DIAGNOSIS — E669 Obesity, unspecified: Secondary | ICD-10-CM | POA: Diagnosis not present

## 2019-02-04 DIAGNOSIS — Z130 Encounter for screening for diseases of the blood and blood-forming organs and certain disorders involving the immune mechanism: Secondary | ICD-10-CM | POA: Diagnosis not present

## 2019-02-04 DIAGNOSIS — Z131 Encounter for screening for diabetes mellitus: Secondary | ICD-10-CM | POA: Diagnosis not present

## 2019-02-04 DIAGNOSIS — K580 Irritable bowel syndrome with diarrhea: Secondary | ICD-10-CM | POA: Diagnosis not present

## 2019-02-04 DIAGNOSIS — E662 Morbid (severe) obesity with alveolar hypoventilation: Secondary | ICD-10-CM | POA: Insufficient documentation

## 2019-02-04 LAB — CBC
HCT: 43.2 % (ref 39.0–52.0)
Hemoglobin: 14.1 g/dL (ref 13.0–17.0)
MCHC: 32.7 g/dL (ref 30.0–36.0)
MCV: 83.7 fl (ref 78.0–100.0)
Platelets: 260 10*3/uL (ref 150.0–400.0)
RBC: 5.16 Mil/uL (ref 4.22–5.81)
RDW: 13.6 % (ref 11.5–15.5)
WBC: 8.6 10*3/uL (ref 4.0–10.5)

## 2019-02-04 LAB — LIPID PANEL
Cholesterol: 214 mg/dL — ABNORMAL HIGH (ref 0–200)
HDL: 45.9 mg/dL (ref >39.00)
NonHDL: 168.3
Total CHOL/HDL Ratio: 5
Triglycerides: 216 mg/dL — ABNORMAL HIGH (ref 0.0–149.0)
VLDL: 43.2 mg/dL — ABNORMAL HIGH (ref 0.0–40.0)

## 2019-02-04 LAB — COMPREHENSIVE METABOLIC PANEL
ALT: 48 U/L (ref 0–53)
AST: 24 U/L (ref 0–37)
Albumin: 4.7 g/dL (ref 3.5–5.2)
Alkaline Phosphatase: 70 U/L (ref 39–117)
BUN: 11 mg/dL (ref 6–23)
CO2: 24 mEq/L (ref 19–32)
Calcium: 9.7 mg/dL (ref 8.4–10.5)
Chloride: 103 mEq/L (ref 96–112)
Creatinine, Ser: 0.74 mg/dL (ref 0.40–1.50)
GFR: 118.19 mL/min (ref >60.00)
Glucose, Bld: 86 mg/dL (ref 70–99)
Potassium: 4.3 mEq/L (ref 3.5–5.1)
Sodium: 138 mEq/L (ref 135–145)
Total Bilirubin: 0.4 mg/dL (ref 0.2–1.2)
Total Protein: 7.8 g/dL (ref 6.0–8.3)

## 2019-02-04 LAB — TSH: TSH: 1.63 u[IU]/mL (ref 0.35–4.50)

## 2019-02-04 LAB — LDL CHOLESTEROL, DIRECT: Direct LDL: 138 mg/dL

## 2019-02-04 LAB — HEMOGLOBIN A1C: Hgb A1c MFr Bld: 5.8 % (ref 4.6–6.5)

## 2019-02-04 MED ORDER — AMITRIPTYLINE HCL 25 MG PO TABS: ORAL_TABLET | ORAL | 3 refills | Status: DC

## 2019-02-04 MED ORDER — PANTOPRAZOLE SODIUM 40 MG PO TBEC: 40.0000 mg | DELAYED_RELEASE_TABLET | Freq: Every day | ORAL | 3 refills | Status: DC

## 2019-02-04 NOTE — Patient Instructions (Signed)

## 2019-02-04 NOTE — Progress Notes (Signed)
Patient ID: Caleb Norris, male  DOB: 1981/01/29, 38 y.o.   MRN: [id number] Patient Care Team    Relationship Specialty Notifications Start End  Ma Hillock, DO PCP - General Family Medicine  02/04/19   Deneise Lever, MD Consulting Physician Pulmonary Disease  01/04/19   Melida Quitter, MD Consulting Physician Otolaryngology  02/04/19     Chief Complaint  Patient presents with  . Establish Care    TOC- prior pcp dr. Anitra Lauth. last CPE was over a year ago. 06/15/17    Subjective:  Caleb Norris is a 38 y.o.  male present for TOC. All past medical history, surgical history, allergies, family history, immunizations, medications and social history were updated in the electronic medical record today. All recent labs, ED visits and hospitalizations within the last year were reviewed.  IBS: pt reports he as been on elavil 10 mg QHS for his IBS. He has never been tried on other medications. He does not feel this is helping as much any more. He denies sedation with use of medication.   GERD: he reports this condition is stable with Protonix use.   OSA: He had his sleep study completed in July and a CPAP is recommended. Secondary to pandemic he has not scheduled with his his ENT for follow up.   Health maintenance:  Colonoscopy: No fhx, routine screen.  Immunizations: tdap 2016, Influenza (encouraged yearly) Infectious disease screening: HIV agreeable today PSA: no fhx, routine screen.  Assistive device: none Oxygen use: none Patient has a Dental home. Hospitalizations/ED visits:reviewed  Depression screen [place] 2/9 02/04/2019 03/16/2017  Decreased Interest 0 0  Down, Depressed, Hopeless 0 0  PHQ - 2 Score 0 0   No flowsheet data found.     No flowsheet data found.  Immunization History  Administered Date(s) Administered  . Influenza,inj,Quad PF,6+ Mos 03/16/2017  . Influenza-Unspecified 02/21/2018  . Tdap 06/23/2014    No exam data present  Past Medical  History:  Diagnosis Date  . GERD (gastroesophageal reflux disease)   . Headache    occassionally  . History of kidney stones   . IBS (irritable bowel syndrome)   . Metabolic syndrome 7579   Trigs, HDL, waist circ  . Obesity, Class I, BMI 30-34.9   . OSA (obstructive sleep apnea) 12/2018   CPAP recommended 12/2018   Allergies  Allergen Reactions  . Compazine [Prochlorperazine Edisylate] Anaphylaxis   Past Surgical History:  Procedure Laterality Date  . LEG SURGERY Right   . MULTIPLE TOOTH EXTRACTIONS    . THYROGLOSSAL DUCT CYST Left 03/16/2015   Procedure: Excision Left neck cyst;  Surgeon: Melida Quitter, MD;  Location: [facility] OR;  Service: ENT;  Laterality: Left;   Family History  Problem Relation Age of Onset  . Hyperlipidemia Father   . Hypertension Father   . Heart disease Maternal Grandfather   . Hyperlipidemia Maternal Grandfather   . Hypertension Maternal Grandfather   . Heart attack Maternal Grandfather   . Stroke Neg Hx   . Diabetes Neg Hx   . Cancer Neg Hx    Social History   Social History Narrative   Married, 1 son and 1 daughter.   Educ: [employer] degree.   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   Tob: former, 15 pack-yr hx.  Quit 2011.   Alcohol: occasional.   Drugs: none.    Allergies as of 02/04/2019      Reactions   Compazine [prochlorperazine Edisylate] Anaphylaxis  Medication List       Accurate as of February 04, 2019  9:58 AM. If you have any questions, ask your nurse or doctor.        amitriptyline 25 MG tablet Commonly known as: ELAVIL TAKE 1 TO 2 TABLETS BY MOUTH EVERY DAY FOR IBS What changed: medication strength Changed by: Howard Pouch, DO   b complex vitamins tablet Take 1 tablet by mouth daily.   cetirizine 10 MG tablet Commonly known as: ZYRTEC Take 10 mg by mouth daily.   multivitamin tablet Take 1 tablet by mouth daily.   pantoprazole 40 MG tablet Commonly known as: PROTONIX Take 1 tablet (40 mg total) by  mouth daily.   vitamin B-12 500 MCG tablet Commonly known as: CYANOCOBALAMIN Take by mouth.       All past medical history, surgical history, allergies, family history, immunizations andmedications were updated in the EMR today and reviewed under the history and medication portions of their EMR.    No results found for this or any previous visit (from the past 2160 hour(s)).  No results found.   ROS: 14 pt review of systems performed and negative (unless mentioned in an HPI)  Objective: BP 127/82 (BP Location: Left Arm, Patient Position: Sitting, Cuff Size: Large)   Pulse 93   Temp 97.7 F (36.5 C) (Temporal)   Resp 17   Ht '5\' 10"'  (1.778 m)   Wt 257 lb 4 oz (116.7 kg)   SpO2 96%   BMI 36.91 kg/m  Gen: Afebrile. No acute distress. Nontoxic in appearance, well-developed, well-nourished,  Obese caucasian male.  HENT: AT. [place]. Bilateral TM visualized and normal in appearance, normal external auditory canal. MMM, no oral lesions, adequate dentition. Bilateral nares within normal limits. Throat without erythema, ulcerations or exudates. no Cough on exam, no hoarseness on exam. Eyes:Pupils Equal Round Reactive to light, Extraocular movements intact,  Conjunctiva without redness, discharge or icterus. Neck/lymp/endocrine: Supple,no lymphadenopathy, no thyromegaly CV: RRR no murmur, no edema, +2/4 P posterior tibialis pulses. no carotid bruits. No JVD. Chest: CTAB, no wheeze, rhonchi or crackles. normal Respiratory effort. good Air movement. Abd: Soft. obese. NTND. BS present. no Masses palpated. No hepatosplenomegaly. No rebound tenderness or guarding. Skin: no rashes, purpura or petechiae. Warm and well-perfused. Skin intact. Neuro/Msk:  Normal gait. PERLA. EOMi. Alert. Oriented x3.  Cranial nerves II through XII intact. Muscle strength 5/5 upper/lower extremity. DTRs equal bilaterally. Psych: Normal affect, dress and demeanor. Normal speech. Normal thought content and judgment.    Assessment/plan: Caleb Norris is a 38 y.o. male present for TOC/CPE Obesity (BMI 30-39.9) Diet and exercise encouraged - Lipid panel Encounter for screening for HIV Pt agreeable to testing today.  - HIV antibody (with reflex) Irritable bowel syndrome with diarrhea Increase to Elavil 25 mg QHS. If condition is not improved can consider bentyl or levsin - Comp Met (CMET) - TSH F/u yearly with cpe as long as stable.  Screening for deficiency anemia - CBC Diabetes mellitus screening - Hemoglobin A1c Gastroesophageal reflux disease without esophagitis Refilled protonix F/u yearly with cpe Encounter for preventive health examination Patient was encouraged to exercise greater than 150 minutes a week. Patient was encouraged to choose a diet filled with fresh fruits and vegetables, and lean meats. AVS provided to patient today for education/recommendation on gender specific health and safety maintenance. Colonoscopy: No fhx, routine screen.  Immunizations: tdap 2016, Influenza (encouraged yearly) Infectious disease screening: HIV agreeable today PSA: no fhx, routine screen  Return  in about 1 year (around 02/04/2020) for CPE (30 min).   Note is dictated utilizing voice recognition software. Although note has been proof read prior to signing, occasional typographical errors still can be missed. If any questions arise, please do not hesitate to call for verification.  Electronically signed by: Howard Pouch, DO [place]

## 2019-02-04 NOTE — Telephone Encounter (Signed)
Please inform patient the following information: His liver, kidney, electrolytes, blood counts, diabetes screen and thyroid are all in normal ranges.  His cholesterol panel overall looked really good with the exception of his triglycerides were elevated.  These  have been consistently mildly elevated over the years by reviewing his old labs.  Would encourage him to start a fish oil supplementation of 2000 mg a day and increase the fiber in his diet.  He can easily increase the fiber by adding Metamucil (which comes and wafers or powders) 5-7 g daily. Increasing exercise and following a Mediterranean diet can also be helpful.  There are many online resources for Mediterranean diet.   Overall all his labs are pretty good.  Making the changes above will be helpful.  Follow up in 1 year with physical and we will continue to monitor his triglycerides then.

## 2019-02-05 LAB — HIV ANTIBODY (ROUTINE TESTING W REFLEX): HIV 1&2 Ab, 4th Generation: NONREACTIVE

## 2019-02-07 NOTE — Telephone Encounter (Signed)
Pt sent mychart message with lab results

## 2019-02-08 NOTE — Telephone Encounter (Signed)
Called patient and went over labs with him. Patient verbalized understanding.

## 2019-03-07 DIAGNOSIS — N200 Calculus of kidney: Secondary | ICD-10-CM | POA: Diagnosis not present

## 2019-03-07 DIAGNOSIS — R310 Gross hematuria: Secondary | ICD-10-CM | POA: Diagnosis not present

## 2019-03-22 DIAGNOSIS — N200 Calculus of kidney: Secondary | ICD-10-CM | POA: Diagnosis not present

## 2019-03-22 DIAGNOSIS — R310 Gross hematuria: Secondary | ICD-10-CM | POA: Diagnosis not present

## 2019-03-23 ENCOUNTER — Other Ambulatory Visit: Payer: Self-pay

## 2019-03-23 ENCOUNTER — Encounter (HOSPITAL_BASED_OUTPATIENT_CLINIC_OR_DEPARTMENT_OTHER): Payer: Self-pay | Admitting: *Deleted

## 2019-03-23 ENCOUNTER — Other Ambulatory Visit: Payer: Self-pay | Admitting: Urology

## 2019-03-23 NOTE — Progress Notes (Signed)
Spoke with patient via telephone for pre op interview. NPO after MN. Patient to take Protonix with a sip of water AM of surgery. Arrival time 67.

## 2019-03-28 ENCOUNTER — Other Ambulatory Visit (HOSPITAL_COMMUNITY)
Admission: RE | Admit: 2019-03-28 | Discharge: 2019-03-28 | Disposition: A | Discharge status: Home / Self Care | Payer: BC Managed Care – PPO | Source: Ambulatory Visit | Attending: Urology | Admitting: Urology

## 2019-03-28 DIAGNOSIS — Z20828 Contact with and (suspected) exposure to other viral communicable diseases: Secondary | ICD-10-CM | POA: Insufficient documentation

## 2019-03-28 DIAGNOSIS — Z01812 Encounter for preprocedural laboratory examination: Secondary | ICD-10-CM | POA: Diagnosis not present

## 2019-03-29 LAB — NOVEL CORONAVIRUS, NAA (HOSP ORDER, SEND-OUT TO REF LAB; TAT 18-24 HRS): SARS-CoV-2, NAA: NOT DETECTED

## 2019-03-31 ENCOUNTER — Ambulatory Visit (HOSPITAL_BASED_OUTPATIENT_CLINIC_OR_DEPARTMENT_OTHER)
Admission: RE | Admit: 2019-03-31 | Discharge: 2019-03-31 | Disposition: A | Discharge status: Home / Self Care | Payer: BC Managed Care – PPO | Attending: Urology | Admitting: Urology

## 2019-03-31 ENCOUNTER — Ambulatory Visit (HOSPITAL_BASED_OUTPATIENT_CLINIC_OR_DEPARTMENT_OTHER): Payer: BC Managed Care – PPO | Admitting: Anesthesiology

## 2019-03-31 ENCOUNTER — Encounter (HOSPITAL_BASED_OUTPATIENT_CLINIC_OR_DEPARTMENT_OTHER): Payer: Self-pay | Admitting: Anesthesiology

## 2019-03-31 ENCOUNTER — Encounter (HOSPITAL_BASED_OUTPATIENT_CLINIC_OR_DEPARTMENT_OTHER)
Admission: RE | Disposition: A | Discharge status: Home / Self Care | Payer: Self-pay | Source: Home / Self Care | Attending: Urology

## 2019-03-31 DIAGNOSIS — R319 Hematuria, unspecified: Secondary | ICD-10-CM | POA: Diagnosis not present

## 2019-03-31 DIAGNOSIS — N35919 Unspecified urethral stricture, male, unspecified site: Secondary | ICD-10-CM | POA: Diagnosis not present

## 2019-03-31 DIAGNOSIS — N200 Calculus of kidney: Secondary | ICD-10-CM | POA: Diagnosis not present

## 2019-03-31 DIAGNOSIS — K219 Gastro-esophageal reflux disease without esophagitis: Secondary | ICD-10-CM | POA: Diagnosis not present

## 2019-03-31 DIAGNOSIS — G4730 Sleep apnea, unspecified: Secondary | ICD-10-CM | POA: Diagnosis not present

## 2019-03-31 DIAGNOSIS — R310 Gross hematuria: Secondary | ICD-10-CM | POA: Insufficient documentation

## 2019-03-31 DIAGNOSIS — Z6836 Body mass index (BMI) 36.0-36.9, adult: Secondary | ICD-10-CM | POA: Insufficient documentation

## 2019-03-31 DIAGNOSIS — R519 Headache, unspecified: Secondary | ICD-10-CM | POA: Insufficient documentation

## 2019-03-31 DIAGNOSIS — Z888 Allergy status to other drugs, medicaments and biological substances status: Secondary | ICD-10-CM | POA: Diagnosis not present

## 2019-03-31 DIAGNOSIS — Z87891 Personal history of nicotine dependence: Secondary | ICD-10-CM | POA: Diagnosis not present

## 2019-03-31 HISTORY — DX: Presence of spectacles and contact lenses: Z97.3

## 2019-03-31 HISTORY — PX: CYSTOSCOPY/URETEROSCOPY/HOLMIUM LASER/STENT PLACEMENT: SHX6546

## 2019-03-31 SURGERY — CYSTOSCOPY/URETEROSCOPY/HOLMIUM LASER/STENT PLACEMENT
Anesthesia: General | Laterality: Right

## 2019-03-31 MED ORDER — HYDROMORPHONE HCL 1 MG/ML IJ SOLN
INTRAMUSCULAR | Status: AC
  Filled 2019-03-31: qty 1

## 2019-03-31 MED ORDER — ACETAMINOPHEN 500 MG PO TABS
1000.0000 mg | ORAL_TABLET | Freq: Once | ORAL | Status: AC
  Administered 2019-03-31: 1000 mg via ORAL
  Filled 2019-03-31: qty 2

## 2019-03-31 MED ORDER — LIDOCAINE 2% (20 MG/ML) 5 ML SYRINGE
INTRAMUSCULAR | Status: AC
  Filled 2019-03-31: qty 5

## 2019-03-31 MED ORDER — BELLADONNA ALKALOIDS-OPIUM 16.2-60 MG RE SUPP
RECTAL | Status: DC | PRN
  Administered 2019-03-31: 1 via RECTAL

## 2019-03-31 MED ORDER — MORPHINE SULFATE (PF) 2 MG/ML IV SOLN
2.0000 mg | INTRAVENOUS | Status: DC | PRN
  Filled 2019-03-31: qty 1

## 2019-03-31 MED ORDER — FENTANYL CITRATE (PF) 100 MCG/2ML IJ SOLN
INTRAMUSCULAR | Status: AC
  Filled 2019-03-31: qty 2

## 2019-03-31 MED ORDER — CEFAZOLIN SODIUM-DEXTROSE 2-4 GM/100ML-% IV SOLN
2.0000 g | INTRAVENOUS | Status: AC
  Administered 2019-03-31: 2 g via INTRAVENOUS
  Filled 2019-03-31: qty 100

## 2019-03-31 MED ORDER — SODIUM CHLORIDE 0.9% FLUSH
3.0000 mL | INTRAVENOUS | Status: DC | PRN
  Filled 2019-03-31: qty 3

## 2019-03-31 MED ORDER — SODIUM CHLORIDE 0.9% FLUSH
3.0000 mL | Freq: Two times a day (BID) | INTRAVENOUS | Status: DC
  Filled 2019-03-31: qty 3

## 2019-03-31 MED ORDER — SODIUM CHLORIDE 0.9 % IV SOLN
250.0000 mL | INTRAVENOUS | Status: DC | PRN
  Filled 2019-03-31: qty 250

## 2019-03-31 MED ORDER — MIDAZOLAM HCL 2 MG/2ML IJ SOLN
INTRAMUSCULAR | Status: AC
  Filled 2019-03-31: qty 2

## 2019-03-31 MED ORDER — FENTANYL CITRATE (PF) 100 MCG/2ML IJ SOLN
INTRAMUSCULAR | Status: DC | PRN
  Administered 2019-03-31 (×4): 50 ug via INTRAVENOUS

## 2019-03-31 MED ORDER — CEFAZOLIN SODIUM-DEXTROSE 2-4 GM/100ML-% IV SOLN
INTRAVENOUS | Status: AC
  Filled 2019-03-31: qty 100

## 2019-03-31 MED ORDER — IOHEXOL 300 MG/ML  SOLN
INTRAMUSCULAR | Status: DC | PRN
  Administered 2019-03-31: 7 mL

## 2019-03-31 MED ORDER — ONDANSETRON HCL 4 MG/2ML IJ SOLN
INTRAMUSCULAR | Status: AC
  Filled 2019-03-31: qty 2

## 2019-03-31 MED ORDER — PHENAZOPYRIDINE HCL 100 MG PO TABS
ORAL_TABLET | ORAL | Status: AC
  Filled 2019-03-31: qty 2

## 2019-03-31 MED ORDER — ACETAMINOPHEN 650 MG RE SUPP
650.0000 mg | RECTAL | Status: DC | PRN
  Filled 2019-03-31: qty 1

## 2019-03-31 MED ORDER — LACTATED RINGERS IV SOLN
INTRAVENOUS | Status: DC
  Administered 2019-03-31 (×2): via INTRAVENOUS
  Filled 2019-03-31: qty 1000

## 2019-03-31 MED ORDER — OXYCODONE HCL 5 MG PO TABS
5.0000 mg | ORAL_TABLET | ORAL | Status: DC | PRN
  Filled 2019-03-31: qty 2

## 2019-03-31 MED ORDER — ACETAMINOPHEN 500 MG PO TABS
ORAL_TABLET | ORAL | Status: AC
  Filled 2019-03-31: qty 2

## 2019-03-31 MED ORDER — PHENAZOPYRIDINE HCL 200 MG PO TABS
200.0000 mg | ORAL_TABLET | Freq: Three times a day (TID) | ORAL | Status: DC
  Administered 2019-03-31: 200 mg via ORAL
  Filled 2019-03-31: qty 1

## 2019-03-31 MED ORDER — PROPOFOL 10 MG/ML IV BOLUS
INTRAVENOUS | Status: DC | PRN
  Administered 2019-03-31: 200 mg via INTRAVENOUS

## 2019-03-31 MED ORDER — PHENAZOPYRIDINE HCL 200 MG PO TABS: 200.0000 mg | ORAL_TABLET | Freq: Three times a day (TID) | ORAL | 1 refills | Status: DC | PRN

## 2019-03-31 MED ORDER — KETOROLAC TROMETHAMINE 30 MG/ML IJ SOLN
30.0000 mg | Freq: Once | INTRAMUSCULAR | Status: AC
  Administered 2019-03-31: 30 mg via INTRAVENOUS
  Filled 2019-03-31: qty 1

## 2019-03-31 MED ORDER — HYDROCODONE-ACETAMINOPHEN 5-325 MG PO TABS: 1.0000 | ORAL_TABLET | Freq: Four times a day (QID) | ORAL | 0 refills | Status: DC | PRN

## 2019-03-31 MED ORDER — ACETAMINOPHEN 325 MG PO TABS
650.0000 mg | ORAL_TABLET | ORAL | Status: DC | PRN
  Filled 2019-03-31: qty 2

## 2019-03-31 MED ORDER — MIDAZOLAM HCL 5 MG/5ML IJ SOLN
INTRAMUSCULAR | Status: DC | PRN
  Administered 2019-03-31: 2 mg via INTRAVENOUS

## 2019-03-31 MED ORDER — KETOROLAC TROMETHAMINE 30 MG/ML IJ SOLN
INTRAMUSCULAR | Status: AC
  Filled 2019-03-31: qty 1

## 2019-03-31 MED ORDER — HYDROMORPHONE HCL 1 MG/ML IJ SOLN
0.2500 mg | INTRAMUSCULAR | Status: DC | PRN
  Administered 2019-03-31: 0.25 mg via INTRAVENOUS
  Filled 2019-03-31: qty 0.5

## 2019-03-31 MED ORDER — LIDOCAINE HCL URETHRAL/MUCOSAL 2 % EX GEL
CUTANEOUS | Status: DC | PRN
  Administered 2019-03-31: 1

## 2019-03-31 MED ORDER — LIDOCAINE 2% (20 MG/ML) 5 ML SYRINGE
INTRAMUSCULAR | Status: DC | PRN
  Administered 2019-03-31: 60 mg via INTRAVENOUS

## 2019-03-31 MED ORDER — PROPOFOL 500 MG/50ML IV EMUL
INTRAVENOUS | Status: AC
  Filled 2019-03-31: qty 50

## 2019-03-31 MED ORDER — DEXAMETHASONE SODIUM PHOSPHATE 4 MG/ML IJ SOLN
INTRAMUSCULAR | Status: DC | PRN
  Administered 2019-03-31: 10 mg via INTRAVENOUS

## 2019-03-31 MED ORDER — DEXAMETHASONE SODIUM PHOSPHATE 10 MG/ML IJ SOLN
INTRAMUSCULAR | Status: AC
  Filled 2019-03-31: qty 1

## 2019-03-31 MED ORDER — ONDANSETRON HCL 4 MG/2ML IJ SOLN
INTRAMUSCULAR | Status: DC | PRN
  Administered 2019-03-31: 4 mg via INTRAVENOUS

## 2019-03-31 SURGICAL SUPPLY — 23 items
BAG DRAIN URO-CYSTO SKYTR STRL (DRAIN) ×2 IMPLANT
BASKET STONE 1.7 NGAGE (UROLOGICAL SUPPLIES) ×1 IMPLANT
CATH URET 5FR 28IN OPEN ENDED (CATHETERS) ×1 IMPLANT
CLOTH BEACON ORANGE TIMEOUT ST (SAFETY) ×2 IMPLANT
CONT SPEC 4OZ CLIKSEAL STRL BL (MISCELLANEOUS) ×1 IMPLANT
FIBER LASER TRAC TIP (UROLOGICAL SUPPLIES) ×1 IMPLANT
GLOVE BIOGEL PI IND STRL 6.5 (GLOVE) IMPLANT
GLOVE BIOGEL PI IND STRL 8 (GLOVE) IMPLANT
GLOVE BIOGEL PI INDICATOR 6.5 (GLOVE) ×1
GLOVE BIOGEL PI INDICATOR 8 (GLOVE) ×1
GLOVE SURG SS PI 8.0 STRL IVOR (GLOVE) ×2 IMPLANT
GOWN STRL REUS W/TWL XL LVL3 (GOWN DISPOSABLE) ×2 IMPLANT
GUIDEWIRE ANG ZIPWIRE 038X150 (WIRE) ×1 IMPLANT
GUIDEWIRE STR DUAL SENSOR (WIRE) ×2 IMPLANT
IV NS IRRIG 3000ML ARTHROMATIC (IV SOLUTION) ×2 IMPLANT
KIT TURNOVER CYSTO (KITS) ×2 IMPLANT
MANIFOLD NEPTUNE II (INSTRUMENTS) ×2 IMPLANT
NS IRRIG 500ML POUR BTL (IV SOLUTION) ×2 IMPLANT
PACK CYSTO (CUSTOM PROCEDURE TRAY) ×2 IMPLANT
SHEATH URETERAL 12FRX35CM (MISCELLANEOUS) ×1 IMPLANT
SHEATH URETERAL 12FRX55CM (UROLOGICAL SUPPLIES) ×1 IMPLANT
TUBE CONNECTING 12X1/4 (SUCTIONS) ×2 IMPLANT
TUBING UROLOGY SET (TUBING) ×1 IMPLANT

## 2019-03-31 NOTE — Discharge Instructions (Addendum)
Ureteral Stent Implantation, Care After °This sheet gives you information about how to care for yourself after your procedure. Your health care provider may also give you more specific instructions. If you have problems or questions, contact your health care provider. °What can I expect after the procedure? °After the procedure, it is common to have: °· Nausea. °· Mild pain when you urinate. You may feel this pain in your lower back or lower abdomen. The pain should stop within a few minutes after you urinate. This may last for up to 1 week. °· A small amount of blood in your urine for several days. °Follow these instructions at home: °Medicines °· Take over-the-counter and prescription medicines only as told by your health care provider. °· If you were prescribed an antibiotic medicine, take it as told by your health care provider. Do not stop taking the antibiotic even if you start to feel better. °· Do not drive for 24 hours if you were given a sedative during your procedure. °· Ask your health care provider if the medicine prescribed to you requires you to avoid driving or using heavy machinery. °Activity °· Rest as told by your health care provider. °· Avoid sitting for a long time without moving. Get up to take short walks every 1-2 hours. This is important to improve blood flow and breathing. Ask for help if you feel weak or unsteady. °· Return to your normal activities as told by your health care provider. Ask your health care provider what activities are safe for you. °General instructions ° °· Watch for any blood in your urine. Call your health care provider if the amount of blood in your urine increases. °· If you have a catheter: °? Follow instructions from your health care provider about taking care of your catheter and collection bag. °? Do not take baths, swim, or use a hot tub until your health care provider approves. Ask your health care provider if you may take showers. You may only be allowed to  take sponge baths. °· Drink enough fluid to keep your urine pale yellow. °· Do not use any products that contain nicotine or tobacco, such as cigarettes, e-cigarettes, and chewing tobacco. These can delay healing after surgery. If you need help quitting, ask your health care provider. °· Keep all follow-up visits as told by your health care provider. This is important. °Contact a health care provider if: °· You have pain that gets worse or does not get better with medicine, especially pain when you urinate. °· You have difficulty urinating. °· You feel nauseous or you vomit repeatedly during a period of more than 2 days after the procedure. °Get help right away if: °· Your urine is dark red or has blood clots in it. °· You are leaking urine (have incontinence). °· The end of the stent comes out of your urethra. °· You cannot urinate. °· You have sudden, sharp, or severe pain in your abdomen or lower back. °· You have a fever. °· You have swelling or pain in your legs. °· You have difficulty breathing. °Summary °· After the procedure, it is common to have mild pain when you urinate that goes away within a few minutes after you urinate. This may last for up to 1 week. °· Watch for any blood in your urine. Call your health care provider if the amount of blood in your urine increases. °· Take over-the-counter and prescription medicines only as told by your health care provider. °· Drink   enough fluid to keep your urine pale yellow.   You may remove the stent using the attached string on Monday morning.  If you don't feel comfortable doing that, please call the office to come in to have it removed.   This information is not intended to replace advice given to you by your health care provider. Make sure you discuss any questions you have with your health care provider. Document Released: 02/09/2013 Document Revised: 03/16/2018 Document Reviewed: 03/17/2018 Elsevier Patient Education  2020 [employer].  No advil ,  aleve, motrin, ibuprofen until 430 pm today  Post Anesthesia Home Care Instructions  Activity: Get plenty of rest for the remainder of the day. A responsible adult should stay with you for 24 hours following the procedure.  For the next 24 hours, DO NOT: -Drive a car -Paediatric nurse -Drink alcoholic beverages -Take any medication unless instructed by your physician -Make any legal decisions or sign important papers.  Meals: Start with liquid foods such as gelatin or soup. Progress to regular foods as tolerated. Avoid greasy, spicy, heavy foods. If nausea and/or vomiting occur, drink only clear liquids until the nausea and/or vomiting subsides. Call your physician if vomiting continues.  Special Instructions/Symptoms: Your throat may feel dry or sore from the anesthesia or the breathing tube placed in your throat during surgery. If this causes discomfort, gargle with warm salt water. The discomfort should disappear within 24 hours.  If you had a scopolamine patch placed behind your ear for the management of post- operative nausea and/or vomiting:  1. The medication in the patch is effective for 72 hours, after which it should be removed.  Wrap patch in a tissue and discard in the trash. Wash hands thoroughly with soap and water. 2. You may remove the patch earlier than 72 hours if you experience unpleasant side effects which may include dry mouth, dizziness or visual disturbances. 3. Avoid touching the patch. Wash your hands with soap and water after contact with the patch.

## 2019-03-31 NOTE — Op Note (Signed)
Procedure: 1.  Cystoscopy with dilation of meatal stenosis. 2.  Cystoscopy with right retrograde pyelogram and interpretation. 3.  Application of fluoroscopy. 4.  Right ureteroscopy with holmium laser application and insertion of right double-J stent.  Preop diagnosis: Right renal stone.  Postop diagnosis: Same.  Surgeon: Dr. Bjorn Pippin.  Anesthesia: General.  Specimen: Stone fragments.  EBL: 3 mL.  Complications: None.  Indications: Caleb Norris is a 38 year old male with a 6 mm right renal stone with intermittent symptoms.  The stone is poorly visualized on plain film.  After reviewing the options for treatment he is elected ureteroscopy.  Procedure: He was taken operating room where he was given 2 g of Ancef.  A general anesthetic was induced.  He was placed in lithotomy position and fitted with PAS hose.  His perineum and genitalia were prepped with Betadine solution he was draped in usual sterile fashion.  An initial attempt at passage of the 26 French cystoscope was unsuccessful due to meatal stenosis.  The meatus was then dilated from 18-26 [place] with Zenaida Niece Buren's sounds.  The cystoscope was then passed and inspection demonstrated a normal urethra.  The external sphincter was intact.  The prostatic urethra short with bilobar hyperplasia without significant obstruction.  Examination of bladder revealed normal mucosa without tumors, stones or inflammation.  There was minimal trabeculation.  Ureteral orifices were unremarkable.  The right ureteral orifice was cannulated with a 5 French opening catheter and contrast was instilled.  The right retrograde pyelogram demonstrated a normal caliber ureter to the kidney.  There was no hydronephrosis.  There was a filling defect in the lower pole consistent with the stone.  A sensor guidewire was then advanced to the kidney through the open-ended catheter and the 12 [place] inner core of a digital access sheath was then advanced to the kidney under  fluoroscopic guidance without difficulty.  The assembled 55 cm sheath was then advanced but was not able to be advanced past the mid proximal ureter.  I then changed to a 35 cm sheath to avoid an excessively lengthy sheath.  The dual-lumen digital flexible scope was then passed through the sheath to the kidney after the wire and inner cord been removed.  Inspection of the kidney demonstrated the stone in the lower pole.  It appeared most consistent with a very fragile calcium oxalate dihydrate stone.  A 200 m tract tip laser fiber was then used to fragment the stone in the manageable fragments.  The laser settings were 0.5 J and 20 Hz.  Once the stone was adequately fragmented the fragments were removed using an engage basket leaving only sand and grit behind.  Once final inspection confirmed no significant residual stone fragments, a guidewire was placed to the kidney through the scope and the scope was backed out while visually inspecting the ureter.  No significant ureteral injury was indicated.  It was felt the stent was indicated.  The cystoscope was then reinserted over the wire and a 6 [place] by 26 cm contour double-J stent with tether was passed the kidney under fluoroscopic guidance.  The wire was removed, leaving good coil in the kidney and a good coil in the bladder.  The bladder was then drained and the cystoscope was removed, leaving the stent string exiting the urethra.  The urethra was instilled with 10 mL of 2% lidocaine jelly and the stent string was secured to the patient's penis with tape.  A B&O suppository was then placed.  He was taken down  from lithotomy position, his anesthetic was reversed and he was moved recovery in stable condition.  There were no complications.  He will be given the stone fragments to bring to the office for analysis.

## 2019-03-31 NOTE — H&P (Signed)
CC: I have blood in my urine.  HPI: Caleb Norris is a 38 year-old male established patient who is here for blood in the urine.  Caleb Norris is a 38 yo WM who is a former patient of Dr. Junious Silk who had the onset 2 days ago of gross hematuria and some lower abdominal pain with right back pain. The pain is moderate but he has no nausea. He has had no frequency or urgency. He has had one prior stone. He has no other GU history.   03/22/2019: CT imaging at last office visit identified a right nonobstructing 8 mm renal pelvic stone which was thought to be the source of his intermittent symptoms. The patient was going on vacation at that time and pain/discomfort was controlled therefore he was prescribed pain medication as well as tamsulosin. He returns today for repeat evaluation. She denies interval stent material passage. He has not required the use of pain medication. Tolerating tamsulosin well. Has noted some increased urinary frequency and what he describes as uncomfortable voiding but denies painful or burning urination, visible blood in the urine. Continues to have intermittent right lower back pain over the CVA area that is intermittent, mild to moderate in severity and usually worsens as the day progresses. No interval fevers or chills, nausea/vomiting.   He did see the blood in his urine. He first noticed the symptoms approximately 03/05/2019.     ALLERGIES: Compazine TABS    MEDICATIONS: Tamsulosin Hcl 0.4 mg capsule 1 capsule PO Daily  Zyrtec  Hydrocodone-Acetaminophen 5 mg-325 mg tablet 1-2 tablet PO Q 6 H  Multi-Vitamin Oral Tablet Oral  Pantoprazole Sodium  Vitamin B-12 TABS Oral     GU PSH: None     PSH Notes: Leg Repair   NON-GU PSH: None   GU PMH: Gross hematuria, he has an 68m right renal stone that is probably moving and causing the hematuria and intermittent pain. I discussed MET, URS and ESWL. I am not sure that the stone will be visible on KUB. He is going on vacation later  this week and took ibuprofen last night so he wouldn't be a candidate for ESWL today. He would like go with MET for now and will f/u after he gets back from vacation in 2 weeks for a KUB and OV. - 03/07/2019 Renal calculus - 03/07/2019 Ureteral Stone, Urethral stone - 2014      PMH Notes:  1898-06-23 00:00:00 - Note: Normal Routine History And Physical Adult   NON-GU PMH: Personal history of other diseases of the digestive system, History of esophageal reflux - 2014 GERD    FAMILY HISTORY: Family Health Status - Father alive at age 38- R5In Family Family Health Status - Mother's Age - [facility] - No Family History   SOCIAL HISTORY: Marital Status: Married Preferred Language: English; Race: White Current Smoking Status: Patient does not smoke anymore.   Tobacco Use Assessment Completed: Used Tobacco in last 30 days? Does not drink caffeine.     Notes: 1 son, 1 daughter    REVIEW OF SYSTEMS:    GU Review Male:   Patient denies frequent urination, hard to postpone urination, burning/ pain with urination, get up at night to urinate, leakage of urine, stream starts and stops, trouble starting your stream, have to strain to urinate , erection problems, and penile pain.  Gastrointestinal (Upper):   Patient denies nausea, vomiting, and indigestion/ heartburn.  Gastrointestinal (Lower):   Patient denies diarrhea  and constipation.  Constitutional:   Patient denies fever, night sweats, weight loss, and fatigue.  Skin:   Patient denies skin rash/ lesion and itching.  Eyes:   Patient denies blurred vision and double vision.  Ears/ Nose/ Throat:   Patient denies sore throat and sinus problems.  Hematologic/Lymphatic:   Patient denies swollen glands and easy bruising.  Cardiovascular:   Patient denies leg swelling and chest pains.  Respiratory:   Patient denies cough and shortness of breath.  Endocrine:   Patient denies excessive thirst.  Musculoskeletal:    Patient reports back pain. Patient denies joint pain.  Neurological:   Patient denies dizziness and headaches.  Psychologic:   Patient denies depression and anxiety.   VITAL SIGNS:      03/22/2019 08:32 AM  BP 126/86 mmHg  Pulse 80 /min  Temperature 97.8 F / 36.5 C   MULTI-SYSTEM PHYSICAL EXAMINATION:    Constitutional: Obese. No physical deformities. Normally developed. Good grooming.   Neck: Neck symmetrical, not swollen. Normal tracheal position.  Respiratory: Normal breath sounds. No labored breathing, no use of accessory muscles.   Cardiovascular: Regular rate and rhythm. No murmur, no gallop.   Skin: No paleness, no jaundice, no cyanosis. No lesion, no ulcer, no rash.  Neurologic / Psychiatric: Oriented to time, oriented to place, oriented to person. No depression, no anxiety, no agitation.  Gastrointestinal: Abdominal tenderness. No mass, no rigidity, non obese abdomen. Mild right CVA tenderness present  Musculoskeletal: Normal gait and station of head and neck.     PAST DATA REVIEWED:  Source Of History:  Patient  Urine Test Review:   Urinalysis  X-Ray Review: KUB: Reviewed Films. Discussed With Patient.  C.T. Stone Protocol: Reviewed Films. Reviewed Report.     03/22/19  Urinalysis  Urine Appearance Clear   Urine Color Yellow   Urine Glucose Neg mg/dL  Urine Bilirubin Neg mg/dL  Urine Ketones Neg mg/dL  Urine Specific Gravity 1.020   Urine Blood Neg ery/uL  Urine pH 6.0   Urine Protein Neg mg/dL  Urine Urobilinogen 0.2 mg/dL  Urine Nitrites Neg   Urine Leukocyte Esterase Neg leu/uL   PROCEDURES:         KUB - 30865  A single view of the abdomen is obtained. The bilateral renal shadows are visualized. No obvious left or right renal calculi couldn't be positively identified. The previously noted right renal calculi on CT imaging cannot be definitively identified today. No obvious right ureteral calculi tracing down the anatomical expected tract of the right ureter  is visualized. Bladder grossly appears free of obstruction.      Patient confirmed No Neulasta OnPro Device.           Urinalysis Dipstick Dipstick Cont'd  Color: Yellow Bilirubin: Neg mg/dL  Appearance: Clear Ketones: Neg mg/dL  Specific Gravity: 1.020 Blood: Neg ery/uL  pH: 6.0 Protein: Neg mg/dL  Glucose: Neg mg/dL Urobilinogen: 0.2 mg/dL    Nitrites: Neg    Leukocyte Esterase: Neg leu/uL    ASSESSMENT:      ICD-10 Details  1 GU:   Renal calculus - N20.0   2   Gross hematuria - R31.0    PLAN:           Orders Labs Urine Culture          Schedule Return Visit/Planned Activity: Next Available Appointment - Schedule Surgery, Follow up MD          Document Letter(s):  Created for Patient: Clinical Summary  Notes:   KUB inconclusive today for definitive stone identification the patient does remain intermittently symptomatic with some mildly bothersome lower urinary tract symptoms. He has not required the use of pain medication. Given the inconclusive nature of the KUB today and his continued symptoms along with the size of the sternum making his chances for passage on medical expulsive therapy less likely, I feel planning for definitive intervention would be appropriate at this time. Discussed with him ureteroscopy.   For ureteroscopy I described the risks which include heart attack, stroke, pulmonary embolus, death, bleeding, infection, damage to contiguous structures, positioning injury, ureteral stricture, ureteral avulsion, ureteral injury, need for ureteral stent, inability to perform ureteroscopy, need for an interval procedure, inability to clear stone burden, stent discomfort and pain.   I will communicate today's findings with the patient's urologist today. He is agreeable to URS if his urologist find's this appropriate. He will continue tamsulosin and monitor for worsening symptoms or interval stone material passage. Urine c/s sent today. He will be contacted  in the near future with plan of care moving forward.

## 2019-03-31 NOTE — Transfer of Care (Signed)
Immediate Anesthesia Transfer of Care Note  Patient: Caleb Norris  Procedure(s) Performed: CYSTOSCOPY RIGHT RETROGRAE PYELOGRAM /URETEROSCOPY HOLMIUM LASER/STENT PLACEMENT (Right )  Patient Location: PACU  Anesthesia Type:General  Level of Consciousness: awake  Airway & Oxygen Therapy: Patient Spontanous Breathing and Patient connected to face mask oxygen  Post-op Assessment: Report given to RN and Post -op Vital signs reviewed and stable  Post vital signs: Reviewed and stable  Last Vitals:  Vitals Value Taken Time  BP    Temp    Pulse 97 03/31/19 1004  Resp    SpO2 100 % 03/31/19 1004  Vitals shown include unvalidated device data.  Last Pain:  Vitals:   03/31/19 0720  TempSrc: Oral  PainSc: 3       Patients Stated Pain Goal: 7 (25/85/27 7824)  Complications: No apparent anesthesia complications

## 2019-03-31 NOTE — Anesthesia Procedure Notes (Signed)
Procedure Name: LMA Insertion Date/Time: 03/31/2019 8:57 AM Performed by: Lieutenant Diego, CRNA Pre-anesthesia Checklist: Patient identified, Emergency Drugs available, Suction available and Patient being monitored Patient Re-evaluated:Patient Re-evaluated prior to induction Oxygen Delivery Method: Circle system utilized Preoxygenation: Pre-oxygenation with 100% oxygen Induction Type: IV induction Ventilation: Mask ventilation without difficulty LMA: LMA inserted LMA Size: 5.0 Number of attempts: 1 Placement Confirmation: positive ETCO2 and breath sounds checked- equal and bilateral Tube secured with: Tape Dental Injury: Teeth and Oropharynx as per pre-operative assessment

## 2019-03-31 NOTE — Anesthesia Preprocedure Evaluation (Addendum)
Anesthesia Evaluation  Patient identified by MRN, date of birth, ID band Patient awake    Reviewed: Allergy & Precautions, H&P , NPO status , Patient's Chart, lab work & pertinent test results  Airway Mallampati: III  TM Distance: >3 FB Neck ROM: Full    Dental no notable dental hx. (+) Teeth Intact, Dental Advisory Given   Pulmonary sleep apnea , former smoker,    Pulmonary exam normal breath sounds clear to auscultation       Cardiovascular negative cardio ROS   Rhythm:Regular Rate:Normal     Neuro/Psych  Headaches, negative psych ROS   GI/Hepatic Neg liver ROS, GERD  Medicated and Controlled,  Endo/Other  Morbid obesity  Renal/GU negative Renal ROS  negative genitourinary   Musculoskeletal   Abdominal   Peds  Hematology negative hematology ROS (+)   Anesthesia Other Findings   Reproductive/Obstetrics negative OB ROS                            Anesthesia Physical Anesthesia Plan  ASA: III  Anesthesia Plan: General   Post-op Pain Management:    Induction: Intravenous  PONV Risk Score and Plan: 3 and Ondansetron, Dexamethasone and Midazolam  Airway Management Planned: LMA  Additional Equipment:   Intra-op Plan:   Post-operative Plan: Extubation in OR  Informed Consent: I have reviewed the patients History and Physical, chart, labs and discussed the procedure including the risks, benefits and alternatives for the proposed anesthesia with the patient or authorized representative who has indicated his/her understanding and acceptance.     Dental advisory given  Plan Discussed with: CRNA  Anesthesia Plan Comments:         Anesthesia Quick Evaluation

## 2019-03-31 NOTE — Anesthesia Postprocedure Evaluation (Signed)
Anesthesia Post Note  Patient: Caleb Norris  Procedure(s) Performed: CYSTOSCOPY RIGHT RETROGRAE PYELOGRAM /URETEROSCOPY HOLMIUM LASER/STENT PLACEMENT (Right )     Patient location during evaluation: PACU Anesthesia Type: General Level of consciousness: awake and alert Pain management: pain level controlled Vital Signs Assessment: post-procedure vital signs reviewed and stable Respiratory status: spontaneous breathing, nonlabored ventilation and respiratory function stable Cardiovascular status: blood pressure returned to baseline and stable Postop Assessment: no apparent nausea or vomiting Anesthetic complications: no    Last Vitals:  Vitals:   03/31/19 1045 03/31/19 1200  BP: (!) 142/87 131/90  Pulse: 80 75  Resp: 15 16  Temp:  36.6 C  SpO2: 95% 97%    Last Pain:  Vitals:   03/31/19 1200  TempSrc:   PainSc: 3                  Tynlee Bayle,W. EDMOND

## 2019-04-01 ENCOUNTER — Encounter (HOSPITAL_BASED_OUTPATIENT_CLINIC_OR_DEPARTMENT_OTHER): Payer: Self-pay | Admitting: Urology

## 2019-04-04 DIAGNOSIS — R310 Gross hematuria: Secondary | ICD-10-CM | POA: Diagnosis not present

## 2019-04-04 DIAGNOSIS — R8271 Bacteriuria: Secondary | ICD-10-CM | POA: Diagnosis not present

## 2019-04-04 DIAGNOSIS — N200 Calculus of kidney: Secondary | ICD-10-CM | POA: Diagnosis not present

## 2019-04-22 ENCOUNTER — Other Ambulatory Visit: Payer: Self-pay

## 2019-04-22 ENCOUNTER — Ambulatory Visit (INDEPENDENT_AMBULATORY_CARE_PROVIDER_SITE_OTHER): Payer: BC Managed Care – PPO

## 2019-04-22 DIAGNOSIS — Z23 Encounter for immunization: Secondary | ICD-10-CM | POA: Diagnosis not present

## 2019-05-03 DIAGNOSIS — R310 Gross hematuria: Secondary | ICD-10-CM | POA: Diagnosis not present

## 2019-05-03 DIAGNOSIS — N200 Calculus of kidney: Secondary | ICD-10-CM | POA: Diagnosis not present

## 2019-08-09 ENCOUNTER — Other Ambulatory Visit: Payer: BC Managed Care – PPO

## 2019-08-09 DIAGNOSIS — Z03818 Encounter for observation for suspected exposure to other biological agents ruled out: Secondary | ICD-10-CM | POA: Diagnosis not present

## 2019-08-09 DIAGNOSIS — J069 Acute upper respiratory infection, unspecified: Secondary | ICD-10-CM | POA: Diagnosis not present

## 2019-08-09 DIAGNOSIS — Z20828 Contact with and (suspected) exposure to other viral communicable diseases: Secondary | ICD-10-CM | POA: Diagnosis not present

## 2019-10-20 ENCOUNTER — Other Ambulatory Visit: Payer: Self-pay

## 2019-10-20 MED ORDER — AMITRIPTYLINE HCL 25 MG PO TABS: ORAL_TABLET | ORAL | 0 refills | Status: DC

## 2019-12-30 ENCOUNTER — Other Ambulatory Visit: Payer: Self-pay

## 2019-12-30 ENCOUNTER — Ambulatory Visit (INDEPENDENT_AMBULATORY_CARE_PROVIDER_SITE_OTHER): Payer: BC Managed Care – PPO

## 2019-12-30 ENCOUNTER — Encounter: Payer: Self-pay | Admitting: Podiatry

## 2019-12-30 ENCOUNTER — Ambulatory Visit: Payer: BC Managed Care – PPO | Admitting: Podiatry

## 2019-12-30 DIAGNOSIS — M79672 Pain in left foot: Secondary | ICD-10-CM

## 2019-12-30 DIAGNOSIS — M722 Plantar fascial fibromatosis: Secondary | ICD-10-CM

## 2019-12-30 MED ORDER — DICLOFENAC SODIUM 75 MG PO TBEC: 75.0000 mg | DELAYED_RELEASE_TABLET | Freq: Two times a day (BID) | ORAL | 2 refills | Status: DC

## 2019-12-30 NOTE — Patient Instructions (Signed)

## 2020-01-02 NOTE — Progress Notes (Signed)
Subjective:   Patient ID: Caleb Norris, male   DOB: 39 y.o.   MRN: [id number]   HPI Patient presents stating he is got exquisite discomfort in his left heel and its been going on for several months.  Does not remember specific injury and he does work work at his weightbearing job but for the most part he is off his foot but it still been very tender and not allowing him to be active.  Patient does not smoke likes to be active   Review of Systems  All other systems reviewed and are negative.       Objective:  Physical Exam Vitals and nursing note reviewed.  Constitutional:      Appearance: He is well-developed.  Pulmonary:     Effort: Pulmonary effort is normal.  Musculoskeletal:        General: Normal range of motion.  Skin:    General: Skin is warm.  Neurological:     Mental Status: He is alert.     Neurovascular status intact muscle strength was found to be adequate range of motion within normal limits.  Patient is found to have equinus condition bilateral and is found to have exquisite discomfort plantar fascia left over right with inflammation fluid of the medial band.  Patient is found to have good digital perfusion well oriented x3     Assessment:  Acute plantar fasciitis left with inflammation fluid around the medial band     Plan:  H&P conditions reviewed.  Today I went ahead did sterile prep and injected the fascia 3 mg Kenalog 5 mg Xylocaine and applied fascial brace.  Gave instructions on anti-inflammatories physical therapy and oral medications diclofenac and explained shoe gear modifications.  Reappoint to recheck  X-rays indicate spur formation no indication stress fracture arthritis

## 2020-01-19 ENCOUNTER — Ambulatory Visit: Payer: BC Managed Care – PPO | Admitting: Podiatry

## 2020-01-19 ENCOUNTER — Encounter: Payer: Self-pay | Admitting: Podiatry

## 2020-01-19 ENCOUNTER — Other Ambulatory Visit: Payer: Self-pay

## 2020-01-19 DIAGNOSIS — M722 Plantar fascial fibromatosis: Secondary | ICD-10-CM | POA: Diagnosis not present

## 2020-01-19 NOTE — Progress Notes (Signed)
Subjective:   Patient ID: Caleb Norris, male   DOB: 39 y.o.   MRN: [id number]   HPI Patient states feeling quite a bit better with minimal discomfort and pain only if he has been very active on his foot   ROS      Objective:  Physical Exam  Neurovascular status intact with significant diminishment of discomfort plantar aspect heel     Assessment:  Acute plantar fasciitis improved     Plan:  Reviewed condition and discussed other treatment options and will continue oral anti-inflammatories topical medicine stretching exercises and shoe gear modifications.  Possible orthotics may be necessary in future and I educated him on this or other treatments depending on how he does long-term

## 2020-02-01 DIAGNOSIS — H60501 Unspecified acute noninfective otitis externa, right ear: Secondary | ICD-10-CM | POA: Diagnosis not present

## 2020-02-04 DIAGNOSIS — U071 COVID-19: Secondary | ICD-10-CM | POA: Diagnosis not present

## 2020-02-04 DIAGNOSIS — H66001 Acute suppurative otitis media without spontaneous rupture of ear drum, right ear: Secondary | ICD-10-CM | POA: Diagnosis not present

## 2020-02-04 DIAGNOSIS — J019 Acute sinusitis, unspecified: Secondary | ICD-10-CM | POA: Diagnosis not present

## 2020-04-15 ENCOUNTER — Other Ambulatory Visit: Payer: Self-pay | Admitting: Family Medicine

## 2020-05-03 ENCOUNTER — Ambulatory Visit: Payer: BC Managed Care – PPO | Admitting: Family Medicine

## 2020-05-07 ENCOUNTER — Ambulatory Visit: Payer: BC Managed Care – PPO | Admitting: Family Medicine

## 2020-05-07 ENCOUNTER — Encounter: Payer: Self-pay | Admitting: Family Medicine

## 2020-05-07 ENCOUNTER — Other Ambulatory Visit: Payer: Self-pay

## 2020-05-07 VITALS — BP 127/82 | HR 84 | Temp 98.2°F | Ht 70.0 in | Wt 273.0 lb

## 2020-05-07 DIAGNOSIS — E669 Obesity, unspecified: Secondary | ICD-10-CM | POA: Diagnosis not present

## 2020-05-07 DIAGNOSIS — K219 Gastro-esophageal reflux disease without esophagitis: Secondary | ICD-10-CM

## 2020-05-07 DIAGNOSIS — K580 Irritable bowel syndrome with diarrhea: Secondary | ICD-10-CM | POA: Diagnosis not present

## 2020-05-07 DIAGNOSIS — Z23 Encounter for immunization: Secondary | ICD-10-CM | POA: Diagnosis not present

## 2020-05-07 DIAGNOSIS — G4733 Obstructive sleep apnea (adult) (pediatric): Secondary | ICD-10-CM | POA: Insufficient documentation

## 2020-05-07 HISTORY — DX: Gastro-esophageal reflux disease without esophagitis: K21.9

## 2020-05-07 MED ORDER — AMITRIPTYLINE HCL 25 MG PO TABS: ORAL_TABLET | ORAL | 3 refills | Status: DC

## 2020-05-07 MED ORDER — PANTOPRAZOLE SODIUM 40 MG PO TBEC: 40.0000 mg | DELAYED_RELEASE_TABLET | Freq: Every day | ORAL | 3 refills | Status: DC

## 2020-05-07 NOTE — Patient Instructions (Signed)
Great to see you today.  I have refilled your meds for 1 yr.  Have a great holiday.   Schedule physical in November next year.

## 2020-05-07 NOTE — Progress Notes (Signed)
Patient ID: Caleb Norris, male  DOB: 1981-05-23, 39 y.o.   MRN: [id number] Patient Care Team    Relationship Specialty Notifications Start End  Natalia Leatherwood, DO PCP - General Family Medicine  02/04/19   Waymon Budge, MD Consulting Physician Pulmonary Disease  01/04/19   Christia Reading, MD Consulting Physician Otolaryngology  02/04/19     Chief Complaint  Patient presents with  . Follow-up    [facility];     Subjective:  Caleb Norris is a 39 y.o.  male present for  IBS: pt reports he as been compliant on elavil 25 mg qhs and it is still helpful.   GERD: he reports this condition is stable with Protonix use daily.    Depression screen [facility] 2/9 05/07/2020 02/04/2019 03/16/2017  Decreased Interest 0 0 0  Down, Depressed, Hopeless - 0 0  PHQ - 2 Score 0 0 0   No flowsheet data found.     No flowsheet data found.  Immunization History  Administered Date(s) Administered  . Influenza,inj,Quad PF,6+ Mos 03/16/2017, 04/22/2019, 05/07/2020  . Influenza-Unspecified 02/21/2018  . Tdap 06/23/2014    No exam data present  Past Medical History:  Diagnosis Date  . GERD (gastroesophageal reflux disease)   . Headache    occassionally  . History of kidney stones   . IBS (irritable bowel syndrome)   . Metabolic syndrome 2018   Trigs, HDL, waist circ  . Obesity, Class I, BMI 30-34.9   . OSA (obstructive sleep apnea) 12/2018   CPAP recommended 12/2018  . Wears contact lenses   . Wears glasses    Allergies  Allergen Reactions  . Compazine [Prochlorperazine Edisylate] Anaphylaxis   Past Surgical History:  Procedure Laterality Date  . CYSTOSCOPY/URETEROSCOPY/HOLMIUM LASER/STENT PLACEMENT Right 03/31/2019   Procedure: CYSTOSCOPY RIGHT RETROGRAE PYELOGRAM /URETEROSCOPY HOLMIUM LASER/STENT PLACEMENT;  Surgeon: Bjorn Pippin, MD;  Location: [facility];  Service: Urology;  Laterality: Right;  . LEG SURGERY Right   . MULTIPLE TOOTH EXTRACTIONS    . THYROGLOSSAL  DUCT CYST Left 03/16/2015   Procedure: Excision Left neck cyst;  Surgeon: Christia Reading, MD;  Location: [facility] OR;  Service: ENT;  Laterality: Left;   Family History  Problem Relation Age of Onset  . Hyperlipidemia Father   . Hypertension Father   . Heart disease Maternal Grandfather   . Hyperlipidemia Maternal Grandfather   . Hypertension Maternal Grandfather   . Heart attack Maternal Grandfather   . Stroke Neg Hx   . Diabetes Neg Hx   . Cancer Neg Hx    Social History   Social History Narrative   Married, 1 son and 1 daughter.   Educ: [employer] degree.   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   Tob: former, 15 pack-yr hx.  Quit 2011.   Alcohol: occasional.   Drugs: none.    Allergies as of 05/07/2020      Reactions   Compazine [prochlorperazine Edisylate] Anaphylaxis      Medication List       Accurate as of May 07, 2020  9:48 AM. If you have any questions, ask your nurse or doctor.        STOP taking these medications   diclofenac 75 MG EC tablet Commonly known as: VOLTAREN Stopped by: Felix Pacini, DO     TAKE these medications   amitriptyline 25 MG tablet Commonly known as: ELAVIL TAKE 1 TO 2 TABLETS BY MOUTH EVERY DAY FOR IBS  b complex vitamins tablet Take 1 tablet by mouth daily.   cetirizine 10 MG tablet Commonly known as: ZYRTEC Take 10 mg by mouth daily.   multivitamin tablet Take 1 tablet by mouth daily.   pantoprazole 40 MG tablet Commonly known as: PROTONIX Take 1 tablet (40 mg total) by mouth daily.   vitamin B-12 500 MCG tablet Commonly known as: CYANOCOBALAMIN Take by mouth.       All past medical history, surgical history, allergies, family history, immunizations andmedications were updated in the EMR today and reviewed under the history and medication portions of their EMR.    No results found for this or any previous visit (from the past 2160 hour(s)).  No results found.   ROS: 14 pt review of systems performed  and negative (unless mentioned in an HPI)  Objective: BP 127/82   Pulse 84   Temp 98.2 F (36.8 C) (Oral)   Ht 5\' 10"  (1.778 m)   Wt 273 lb (123.8 kg)   SpO2 98%   BMI 39.17 kg/m  Gen: Afebrile. No acute distress. Nontoxic pleasant male.  HENT: AT. .  Eyes:Pupils Equal Round Reactive to light, Extraocular movements intact,  Conjunctiva without redness, discharge or icterus. Neck/lymp/endocrine: Supple,no lymphadenopathy, no thyromegaly CV: RRR no murmur, no edema, +2/4 P posterior tibialis pulses Chest: CTAB, no wheeze or crackles Abd: Soft. NTND. BS present. no Masses palpated.  Neuro:  Normal gait. PERLA. EOMi. Alert. Oriented x3  Psych: Normal affect, dress and demeanor. Normal speech. Normal thought content and judgment.   Assessment/plan: ORMOND LAZO is a 39 y.o. male present for  Irritable bowel syndrome with diarrhea Increase to Elavil 25 mg QHS. If condition is not improved can consider bentyl or levsin   Gastroesophageal reflux disease without esophagitis Refilled protonix   Influenza vaccine administered today  Return in about 1 year (around 04/23/2021) for CPE (30 min).  Meds ordered this encounter  Medications  . pantoprazole (PROTONIX) 40 MG tablet    Sig: Take 1 tablet (40 mg total) by mouth daily.    Dispense:  90 tablet    Refill:  3  . amitriptyline (ELAVIL) 25 MG tablet    Sig: TAKE 1 TO 2 TABLETS BY MOUTH EVERY DAY FOR IBS    Dispense:  90 tablet    Refill:  3   Orders Placed This Encounter  Procedures  . Flu Vaccine QUAD 6+ mos PF IM (Fluarix Quad PF)    Note is dictated utilizing voice recognition software. Although note has been proof read prior to signing, occasional typographical errors still can be missed. If any questions arise, please do not hesitate to call for verification.  Electronically signed by: 13/06/2020, DO [facility] [place]

## 2020-06-01 DIAGNOSIS — Z03818 Encounter for observation for suspected exposure to other biological agents ruled out: Secondary | ICD-10-CM | POA: Diagnosis not present

## 2020-06-01 DIAGNOSIS — Z20822 Contact with and (suspected) exposure to covid-19: Secondary | ICD-10-CM | POA: Diagnosis not present

## 2020-06-19 ENCOUNTER — Other Ambulatory Visit: Payer: Self-pay | Admitting: Family Medicine

## 2020-09-24 DIAGNOSIS — H35363 Drusen (degenerative) of macula, bilateral: Secondary | ICD-10-CM | POA: Diagnosis not present

## 2020-09-25 ENCOUNTER — Telehealth: Payer: Self-pay

## 2020-09-25 ENCOUNTER — Ambulatory Visit (INDEPENDENT_AMBULATORY_CARE_PROVIDER_SITE_OTHER): Payer: BC Managed Care – PPO | Admitting: Family Medicine

## 2020-09-25 ENCOUNTER — Encounter: Payer: Self-pay | Admitting: Family Medicine

## 2020-09-25 ENCOUNTER — Other Ambulatory Visit: Payer: Self-pay

## 2020-09-25 VITALS — BP 121/81 | HR 102 | Temp 98.2°F | Ht 70.0 in | Wt 276.0 lb

## 2020-09-25 DIAGNOSIS — R030 Elevated blood-pressure reading, without diagnosis of hypertension: Secondary | ICD-10-CM

## 2020-09-25 NOTE — Telephone Encounter (Signed)
[facility] Day - Client TELEPHONE ADVICE RECORD AccessNurse Patient Name:Caleb Norris Gender: Male DOB: [birth year] Age: 40 Y 11 M 3 D Return Phone [phone](Primary) [name] [address] Client [facility] Day - Client Client Site [facility] - Day Physician [place], [place] Contact Type Call Who Is Calling Patient / Member / Family / Caregiver Call Type Triage / Clinical Relationship To Patient Self Return Phone Number [phone] (Primary) Chief Complaint BLOOD PRESSURE HIGH - Diastolic (bottom number) 120 or greater Reason for Call Symptomatic / Request for Health Information Initial Comment Caller states he left his eye DR and his blood pressure was 163/123, no current symptoms. Additional Comment appointment for 09/25/2020 @ 1130 Translation No Nurse Assessment Nurse: Gerre Pebbles, RN, Casimiro Needle Date/Time Lamount Cohen Time): 09/24/2020 12:11:59 PM Confirm and document reason for call. If symptomatic, describe symptoms. ---Caller states he went to the eye doctor and his blood pressure was 163/123, no current symptoms. Does the patient have any new or worsening symptoms? ---Yes Will a triage be completed? ---Yes Related visit to physician within the last 2 weeks? ---No Does the PT have any chronic conditions? (i.e. diabetes, asthma, this includes High risk factors for pregnancy, etc.) ---No Is this a behavioral health or substance abuse call? ---No Guidelines Guideline Title Affirmed Question Affirmed Notes Nurse Date/Time (Eastern Time) Blood Pressure - High Systolic BP >= 180 OR Diastolic >= 110 Gust Brooms 09/24/2020 12:12:57 PM Disp. Time Lamount Cohen Time) Disposition Final User 09/24/2020 12:09:43 PM Send to Urgent Queue Emeline Gins 09/24/2020 12:16:22 PM See PCP within 24 Hours Yes Gerre Pebbles, RN, Casimiro Needle PLEASE NOTE: All timestamps contained within this report are represented as [place] Standard  Time. CONFIDENTIALTY NOTICE: This fax transmission is intended only for the addressee. It contains information that is legally privileged, confidential or otherwise protected from use or disclosure. If you are not the intended recipient, you are strictly prohibited from reviewing, disclosing, copying using or disseminating any of this information or taking any action in reliance on or regarding this information. If you have received this fax in error, please notify us immediately by telephone so that we can arrange for its return to [place]. Phone: [phone], Toll-Free: [phone], Fax: [phone] Page: 2 of 3 Call Id: [id number] Caller Disagree/Comply Comply Caller Understands Yes PreDisposition Call Doctor Care Advice Given Per Guideline CARE ADVICE given per High Blood Pressure (Adult) guideline. * You become worse * Chest pain or difficulty breathing occurs * Difficulty walking, difficulty talking, or severe headache occurs * Weakness or numbness of the face, arm or leg on one side of the body occurs CALL BACK IF: SEE PCP WITHIN 24 HOURS: * IF OFFICE WILL BE OPEN: You need to be examined within the next 24 hours. Call your doctor (or NP/PA) when the office opens and make an appointment. * IF OFFICE WILL BE CLOSED: You need to be seen within the next 24 hours. A clinic or an urgent care center is often a good source of care if your doctor's office is closed or you can't get an appointment. Referrals REFERRED TO PCP OFFICE Warm transfer to backline

## 2020-09-25 NOTE — Patient Instructions (Signed)
Your blood pressure today is normal and looks great. No concerns.

## 2020-09-25 NOTE — Progress Notes (Signed)
This visit occurred during the SARS-CoV-2 public health emergency.  Safety protocols were in place, including screening questions prior to the visit, additional usage of staff PPE, and extensive cleaning of exam room while observing appropriate contact time as indicated for disinfecting solutions.    Caleb Norris , February 16, 1981, 40 y.o., male MRN: [id number] Patient Care Team    Relationship Specialty Notifications Start End  Natalia Leatherwood, DO PCP - General Family Medicine  02/04/19   Waymon Budge, MD Consulting Physician Pulmonary Disease  01/04/19   Christia Reading, MD Consulting Physician Otolaryngology  02/04/19     Chief Complaint  Patient presents with  . Hypertension    Pt had elevated BP during eye doc appt;      Subjective: Pt presents for an OV with complaints of elevated BP during his eye appt today 163/123. He has not had elevated blood pressures in the past or diagnosis of hypertension. He reports he was asymptomatic.  Depression screen [facility] 2/9 05/07/2020 02/04/2019 03/16/2017  Decreased Interest 0 0 0  Down, Depressed, Hopeless - 0 0  PHQ - 2 Score 0 0 0    Allergies  Allergen Reactions  . Compazine [Prochlorperazine Edisylate] Anaphylaxis   Social History   Social History Narrative   Married, 1 son and 1 daughter.   Educ: [employer] degree.   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   Tob: former, 15 pack-yr hx.  Quit 2011.   Alcohol: occasional.   Drugs: none.   Past Medical History:  Diagnosis Date  . GERD (gastroesophageal reflux disease)   . Headache    occassionally  . History of kidney stones   . IBS (irritable bowel syndrome)   . Metabolic syndrome 2018   Trigs, HDL, waist circ  . Obesity, Class I, BMI 30-34.9   . OSA (obstructive sleep apnea) 12/2018   CPAP recommended 12/2018  . Wears contact lenses   . Wears glasses    Past Surgical History:  Procedure Laterality Date  . CYSTOSCOPY/URETEROSCOPY/HOLMIUM LASER/STENT  PLACEMENT Right 03/31/2019   Procedure: CYSTOSCOPY RIGHT RETROGRAE PYELOGRAM /URETEROSCOPY HOLMIUM LASER/STENT PLACEMENT;  Surgeon: Bjorn Pippin, MD;  Location: [facility];  Service: Urology;  Laterality: Right;  . LEG SURGERY Right   . MULTIPLE TOOTH EXTRACTIONS    . THYROGLOSSAL DUCT CYST Left 03/16/2015   Procedure: Excision Left neck cyst;  Surgeon: Christia Reading, MD;  Location: [facility] OR;  Service: ENT;  Laterality: Left;   Family History  Problem Relation Age of Onset  . Hyperlipidemia Father   . Hypertension Father   . Heart disease Maternal Grandfather   . Hyperlipidemia Maternal Grandfather   . Hypertension Maternal Grandfather   . Heart attack Maternal Grandfather   . Stroke Neg Hx   . Diabetes Neg Hx   . Cancer Neg Hx    Allergies as of 09/25/2020      Reactions   Compazine [prochlorperazine Edisylate] Anaphylaxis      Medication List       Accurate as of September 25, 2020  2:39 PM. If you have any questions, ask your nurse or doctor.        amitriptyline 25 MG tablet Commonly known as: ELAVIL TAKE 1 TO 2 TABLETS BY MOUTH EVERY DAY FOR IBS   b complex vitamins tablet Take 1 tablet by mouth daily.   cetirizine 10 MG tablet Commonly known as: ZYRTEC Take 10 mg by mouth daily.   multivitamin tablet Take 1 tablet  by mouth daily.   pantoprazole 40 MG tablet Commonly known as: PROTONIX Take 1 tablet (40 mg total) by mouth daily.   vitamin B-12 500 MCG tablet Commonly known as: CYANOCOBALAMIN Take by mouth.       All past medical history, surgical history, allergies, family history, immunizations andmedications were updated in the EMR today and reviewed under the history and medication portions of their EMR.     ROS: Negative, with the exception of above mentioned in HPI   Objective:  BP 121/81   Pulse (!) 102   Temp 98.2 F (36.8 C) (Oral)   Ht 5\' 10"  (1.778 m)   Wt 276 lb (125.2 kg)   SpO2 97%   BMI 39.60 kg/m  Body mass index is 39.6  kg/m. Gen: Afebrile. No acute distress. Nontoxic in appearance, well developed, well nourished. Pleasant obese male.  HENT: AT. [place]. MMM Eyes:Pupils Equal Round Reactive to light, Extraocular movements intact,  Conjunctiva without redness, discharge or icterus. CV: RRR no murmur, no edema Chest: CTAB, no wheeze or crackles. Neuro: Normal gait. PERLA. EOMi. Alert. Oriented x3  Psych: Normal affect, dress and demeanor. Normal speech. Normal thought content and judgment.  No exam data present No results found. No results found for this or any previous visit (from the past 24 hour(s)).  Assessment/Plan: Caleb Norris is a 40 y.o. male present for OV for  Elevated BP without diagnosis of hypertension BP is normal. Likely wither anxiousness during eye appt or user error. In the future he should request use of an extra large arm cuff only, if they plan to take his BP. A regular arm  Arm cuff or wrist cuff would not be accurate on a gentlemen his size.  Pt reports understanding.    Reviewed expectations re: course of current medical issues.  Discussed self-management of symptoms.  Outlined signs and symptoms indicating need for more acute intervention.  Patient verbalized understanding and all questions were answered.  Patient received an After-Visit Summary.    No orders of the defined types were placed in this encounter.  No orders of the defined types were placed in this encounter.  Referral Orders  No referral(s) requested today     Note is dictated utilizing voice recognition software. Although note has been proof read prior to signing, occasional typographical errors still can be missed. If any questions arise, please do not hesitate to call for verification.   electronically signed by:  24, DO  [facility]

## 2020-10-22 DIAGNOSIS — H35363 Drusen (degenerative) of macula, bilateral: Secondary | ICD-10-CM | POA: Diagnosis not present

## 2021-04-22 DIAGNOSIS — J069 Acute upper respiratory infection, unspecified: Secondary | ICD-10-CM | POA: Diagnosis not present

## 2021-04-23 ENCOUNTER — Other Ambulatory Visit: Payer: Self-pay

## 2021-04-23 ENCOUNTER — Encounter: Payer: Self-pay | Admitting: Family Medicine

## 2021-04-23 ENCOUNTER — Ambulatory Visit (INDEPENDENT_AMBULATORY_CARE_PROVIDER_SITE_OTHER): Payer: BC Managed Care – PPO | Admitting: Family Medicine

## 2021-04-23 VITALS — BP 130/84 | HR 91 | Temp 98.5°F | Ht 69.0 in | Wt 270.0 lb

## 2021-04-23 DIAGNOSIS — K580 Irritable bowel syndrome with diarrhea: Secondary | ICD-10-CM | POA: Diagnosis not present

## 2021-04-23 DIAGNOSIS — Z0000 Encounter for general adult medical examination without abnormal findings: Secondary | ICD-10-CM | POA: Diagnosis not present

## 2021-04-23 DIAGNOSIS — K219 Gastro-esophageal reflux disease without esophagitis: Secondary | ICD-10-CM

## 2021-04-23 DIAGNOSIS — Z1159 Encounter for screening for other viral diseases: Secondary | ICD-10-CM | POA: Diagnosis not present

## 2021-04-23 DIAGNOSIS — Z131 Encounter for screening for diabetes mellitus: Secondary | ICD-10-CM | POA: Diagnosis not present

## 2021-04-23 DIAGNOSIS — B349 Viral infection, unspecified: Secondary | ICD-10-CM

## 2021-04-23 DIAGNOSIS — E669 Obesity, unspecified: Secondary | ICD-10-CM

## 2021-04-23 LAB — LIPID PANEL
Cholesterol: 176 mg/dL (ref 0–200)
HDL: 39 mg/dL — ABNORMAL LOW (ref >39.00)
LDL Cholesterol: 97 mg/dL (ref 0–99)
NonHDL: 136.81
Total CHOL/HDL Ratio: 5
Triglycerides: 199 mg/dL — ABNORMAL HIGH (ref 0.0–149.0)
VLDL: 39.8 mg/dL (ref 0.0–40.0)

## 2021-04-23 LAB — HEMOGLOBIN A1C: Hgb A1c MFr Bld: 5.9 % (ref 4.6–6.5)

## 2021-04-23 LAB — COMPREHENSIVE METABOLIC PANEL
ALT: 32 U/L (ref 0–53)
AST: 19 U/L (ref 0–37)
Albumin: 4.1 g/dL (ref 3.5–5.2)
Alkaline Phosphatase: 69 U/L (ref 39–117)
BUN: 7 mg/dL (ref 6–23)
CO2: 26 mEq/L (ref 19–32)
Calcium: 8.8 mg/dL (ref 8.4–10.5)
Chloride: 104 mEq/L (ref 96–112)
Creatinine, Ser: 0.68 mg/dL (ref 0.40–1.50)
GFR: 116.27 mL/min (ref >60.00)
Glucose, Bld: 78 mg/dL (ref 70–99)
Potassium: 4.3 mEq/L (ref 3.5–5.1)
Sodium: 138 mEq/L (ref 135–145)
Total Bilirubin: 0.5 mg/dL (ref 0.2–1.2)
Total Protein: 6.8 g/dL (ref 6.0–8.3)

## 2021-04-23 LAB — CBC
HCT: 43 % (ref 39.0–52.0)
Hemoglobin: 13.6 g/dL (ref 13.0–17.0)
MCHC: 31.6 g/dL (ref 30.0–36.0)
MCV: 84.2 fl (ref 78.0–100.0)
Platelets: 232 10*3/uL (ref 150.0–400.0)
RBC: 5.1 Mil/uL (ref 4.22–5.81)
RDW: 14.7 % (ref 11.5–15.5)
WBC: 9.7 10*3/uL (ref 4.0–10.5)

## 2021-04-23 LAB — TSH: TSH: 1.2 u[IU]/mL (ref 0.35–5.50)

## 2021-04-23 MED ORDER — AMITRIPTYLINE HCL 25 MG PO TABS: ORAL_TABLET | ORAL | 3 refills | Status: DC

## 2021-04-23 MED ORDER — PANTOPRAZOLE SODIUM 40 MG PO TBEC: 40.0000 mg | DELAYED_RELEASE_TABLET | Freq: Every day | ORAL | 3 refills | Status: DC

## 2021-04-23 NOTE — Patient Instructions (Signed)
Great to see you today.  I have refilled the medication(s) we provide.   If labs were collected, we will inform you of lab results once received either by echart message or telephone call.   - echart message- for normal results that have been seen by the patient already.   - telephone call: abnormal results or if patient has not viewed results in their echart.  Health Maintenance, Male Adopting a healthy lifestyle and getting preventive care are important in promoting health and wellness. Ask your health care provider about: The right schedule for you to have regular tests and exams. Things you can do on your own to prevent diseases and keep yourself healthy. What should I know about diet, weight, and exercise? Eat a healthy diet  Eat a diet that includes plenty of vegetables, fruits, low-fat dairy products, and lean protein. Do not eat a lot of foods that are high in solid fats, added sugars, or sodium. Maintain a healthy weight Body mass index (BMI) is a measurement that can be used to identify possible weight problems. It estimates body fat based on height and weight. Your health care provider can help determine your BMI and help you achieve or maintain a healthy weight. Get regular exercise Get regular exercise. This is one of the most important things you can do for your health. Most adults should: Exercise for at least 150 minutes each week. The exercise should increase your heart rate and make you sweat (moderate-intensity exercise). Do strengthening exercises at least twice a week. This is in addition to the moderate-intensity exercise. Spend less time sitting. Even light physical activity can be beneficial. Watch cholesterol and blood lipids Have your blood tested for lipids and cholesterol at 40 years of age, then have this test every 5 years. You may need to have your cholesterol levels checked more often if: Your lipid or cholesterol levels are high. You are older than 40  years of age. You are at high risk for heart disease. What should I know about cancer screening? Many types of cancers can be detected early and may often be prevented. Depending on your health history and family history, you may need to have cancer screening at various ages. This may include screening for: Colorectal cancer. Prostate cancer. Skin cancer. Lung cancer. What should I know about heart disease, diabetes, and high blood pressure? Blood pressure and heart disease High blood pressure causes heart disease and increases the risk of stroke. This is more likely to develop in people who have high blood pressure readings, are of African descent, or are overweight. Talk with your health care provider about your target blood pressure readings. Have your blood pressure checked: Every 3-5 years if you are 18-39 years of age. Every year if you are 40 years old or older. If you are between the ages of 65 and 75 and are a current or former smoker, ask your health care provider if you should have a one-time screening for abdominal aortic aneurysm (AAA). Diabetes Have regular diabetes screenings. This checks your fasting blood sugar level. Have the screening done: Once every three years after age 45 if you are at a normal weight and have a low risk for diabetes. More often and at a younger age if you are overweight or have a high risk for diabetes. What should I know about preventing infection? Hepatitis B If you have a higher risk for hepatitis B, you should be screened for this virus. Talk with your health care   provider to find out if you are at risk for hepatitis B infection. Hepatitis C Blood testing is recommended for: Everyone born from 1945 through 1965. Anyone with known risk factors for hepatitis C. Sexually transmitted infections (STIs) You should be screened each year for STIs, including gonorrhea and chlamydia, if: You are sexually active and are younger than 40 years of age. You  are older than 40 years of age and your health care provider tells you that you are at risk for this type of infection. Your sexual activity has changed since you were last screened, and you are at increased risk for chlamydia or gonorrhea. Ask your health care provider if you are at risk. Ask your health care provider about whether you are at high risk for HIV. Your health care provider may recommend a prescription medicine to help prevent HIV infection. If you choose to take medicine to prevent HIV, you should first get tested for HIV. You should then be tested every 3 months for as long as you are taking the medicine. Follow these instructions at home: Lifestyle Do not use any products that contain nicotine or tobacco, such as cigarettes, e-cigarettes, and chewing tobacco. If you need help quitting, ask your health care provider. Do not use street drugs. Do not share needles. Ask your health care provider for help if you need support or information about quitting drugs. Alcohol use Do not drink alcohol if your health care provider tells you not to drink. If you drink alcohol: Limit how much you have to 0-2 drinks a day. Be aware of how much alcohol is in your drink. In the [place] one drink equals one 12 oz bottle of beer (355 mL), one 5 oz glass of wine (148 mL), or one 1 oz glass of hard liquor (44 mL). General instructions Schedule regular health, dental, and eye exams. Stay current with your vaccines. Tell your health care provider if: You often feel depressed. You have ever been abused or do not feel safe at home. Summary Adopting a healthy lifestyle and getting preventive care are important in promoting health and wellness. Follow your health care provider's instructions about healthy diet, exercising, and getting tested or screened for diseases. Follow your health care provider's instructions on monitoring your cholesterol and blood pressure. This information is not intended to  replace advice given to you by your health care provider. Make sure you discuss any questions you have with your health care provider. Document Revised: 08/17/2020 Document Reviewed: 06/02/2018 Elsevier Patient Education  2022 Elsevier Inc.  

## 2021-04-23 NOTE — Progress Notes (Signed)
This visit occurred during the SARS-CoV-2 public health emergency.  Safety protocols were in place, including screening questions prior to the visit, additional usage of staff PPE, and extensive cleaning of exam room while observing appropriate contact time as indicated for disinfecting solutions.    Patient ID: Caleb Norris, male  DOB: 07-Jan-1981, 40 y.o.   MRN: [id number] Patient Care Team    Relationship Specialty Notifications Start End  Natalia Leatherwood, DO PCP - General Family Medicine  02/04/19   Waymon Budge, MD Consulting Physician Pulmonary Disease  01/04/19   Christia Reading, MD Consulting Physician Otolaryngology  02/04/19     Chief Complaint  Patient presents with   Annual Exam    Pt is fasting    Subjective: Caleb Norris is a 40 y.o. male present for CPE/cmc All past medical history, surgical history, allergies, family history, immunizations, medications and social history were updated in the electronic medical record today. All recent labs, ED visits and hospitalizations within the last year were reviewed.  Health maintenance:  Colonoscopy: No fhx, routine screen.  Immunizations: tdap 2016, Influenza (encouraged yearly) ill today. Covid series completed. Infectious disease screening: HIV completed; hep agreeable to test.  PSA: no fhx, routine screen No results found for: PSA, pt was counseled on prostate cancer screenings.  Assistive device: none Oxygen use: none Patient has a Dental home. Hospitalizations/ED visits: none  IBS: pt reports he as been out of meds, but wants to restart on elavil 25 mg qhs and it is still helpful.    GERD: he reports this condition is stable with Protonix use daily.    Fatigue: Pt reports Sunday night he started feeling tired and achy. He was seen at the [facility] on Monday and was tested for flu and covid-both negative. He reports he also has a headache. He felt feverish, but no fever by temp. He denies any other sx resp or GI. He  reports "something" has been going around his work.   Depression screen [facility] 2/9 04/23/2021 05/07/2020 02/04/2019 03/16/2017  Decreased Interest 0 0 0 0  Down, Depressed, Hopeless 0 - 0 0  PHQ - 2 Score 0 0 0 0   No flowsheet data found.      No flowsheet data found.    Immunization History  Administered Date(s) Administered   Influenza,inj,Quad PF,6+ Mos 03/16/2017, 04/22/2019, 05/07/2020   Influenza-Unspecified 02/21/2018   Tdap 06/23/2014    Past Medical History:  Diagnosis Date   GERD (gastroesophageal reflux disease)    Headache    occassionally   History of kidney stones    IBS (irritable bowel syndrome)    Metabolic syndrome 2018   Trigs, HDL, waist circ   Obesity, Class I, BMI 30-34.9    OSA (obstructive sleep apnea) 12/2018   CPAP recommended 12/2018   Wears contact lenses    Wears glasses    Allergies  Allergen Reactions   Compazine [Prochlorperazine Edisylate] Anaphylaxis   Past Surgical History:  Procedure Laterality Date   CYSTOSCOPY/URETEROSCOPY/HOLMIUM LASER/STENT PLACEMENT Right 03/31/2019   Procedure: CYSTOSCOPY RIGHT RETROGRAE PYELOGRAM /URETEROSCOPY HOLMIUM LASER/STENT PLACEMENT;  Surgeon: Bjorn Pippin, MD;  Location: [facility] [place];  Service: Urology;  Laterality: Right;   LEG SURGERY Right    MULTIPLE TOOTH EXTRACTIONS     THYROGLOSSAL DUCT CYST Left 03/16/2015   Procedure: Excision Left neck cyst;  Surgeon: Christia Reading, MD;  Location: [facility] OR;  Service: ENT;  Laterality: Left;   Family History  Problem Relation Age  of Onset   Hyperlipidemia Father    Hypertension Father    Heart disease Maternal Grandfather    Hyperlipidemia Maternal Grandfather    Hypertension Maternal Grandfather    Heart attack Maternal Grandfather    Stroke Neg Hx    Diabetes Neg Hx    Cancer Neg Hx    Social History   Social History Narrative   Married, 1 son and 1 daughter.   Educ: [employer] degree.   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    Tob: former, 15 pack-yr hx.  Quit 2011.   Alcohol: occasional.   Drugs: none.    Allergies as of 04/23/2021       Reactions   Compazine [prochlorperazine Edisylate] Anaphylaxis        Medication List        Accurate as of April 23, 2021  8:55 AM. If you have any questions, ask your nurse or doctor.          amitriptyline 25 MG tablet Commonly known as: ELAVIL TAKE 1 TO 2 TABLETS BY MOUTH EVERY DAY FOR IBS   b complex vitamins tablet Take 1 tablet by mouth daily.   cetirizine 10 MG tablet Commonly known as: ZYRTEC Take 10 mg by mouth daily.   multivitamin tablet Take 1 tablet by mouth daily.   pantoprazole 40 MG tablet Commonly known as: PROTONIX Take 1 tablet (40 mg total) by mouth daily.   vitamin B-12 500 MCG tablet Commonly known as: CYANOCOBALAMIN Take by mouth.       All past medical history, surgical history, allergies, family history, immunizations andmedications were updated in the EMR today and reviewed under the history and medication portions of their EMR.     No results found for this or any previous visit (from the past 2160 hour(s)).  No results found.   ROS: 14 pt review of systems performed and negative (unless mentioned in an HPI)  Objective: BP 130/84   Pulse 91   Temp 98.5 F (36.9 C) (Oral)   Ht 5\' 9"  (1.753 m)   Wt 270 lb (122.5 kg)   SpO2 99%   BMI 39.87 kg/m  Gen: Afebrile. No acute distress. Nontoxic in appearance, well-developed, well-nourished,  very pleasant male.  HENT: AT. [place]. Bilateral TM visualized and normal in appearance, normal external auditory canal. MMM, no oral lesions, adequate dentition. Bilateral nares within normal limits. Throat without erythema, ulcerations or exudates. no Cough on exam, no hoarseness on exam. Eyes:Pupils Equal Round Reactive to light, Extraocular movements intact,  Conjunctiva without redness, discharge or icterus. Neck/lymp/endocrine: Supple,no lymphadenopathy, no thyromegaly CV: RRR  no murmur, no edema, +2/4 P posterior tibialis pulses.  Chest: CTAB, no wheeze, rhonchi or crackles. normal Respiratory effort. good Air movement. Abd: Soft. flat. NTND. BS present. no Masses palpated. No hepatosplenomegaly. No rebound tenderness or guarding. Skin: no rashes, purpura or petechiae. Warm and well-perfused. Skin intact. Neuro/Msk: Normal gait. PERLA. EOMi. Alert. Oriented x3.  Cranial nerves II through XII intact. Muscle strength 5/5 upper/lower extremity. DTRs equal bilaterally. Psych: Normal affect, dress and demeanor. Normal speech. Normal thought content and judgment.  No results found.  Assessment/plan: Caleb Norris is a 40 y.o. male present for CPE Irritable bowel syndrome with diarrhea Stable. Continue/restart Elavil 25 mg QHS.   - CBC - Comprehensive metabolic panel - TSH   Gastroesophageal reflux disease without esophagitis Stable. Continue protonix  Obesity (BMI 30-39.9) - Lipid panel Diabetes mellitus screening - Hemoglobin A1c Need for hepatitis  C screening test - Hepatitis C antibody  Viral illness:  Likely viral illness. Neg flu and covid at [facility] Encouraged rest, hydration and nsaids.  Exam is normal today  Routine general medical examination at a health care facility Colonoscopy: No fhx, routine screen.  Immunizations: tdap 2016, Influenza (encouraged yearly) ill today. Covid series completed. Infectious disease screening: HIV completed; hep agreeable to test.  PSA: no fhx, routine screen Patient was encouraged to exercise greater than 150 minutes a week. Patient was encouraged to choose a diet filled with fresh fruits and vegetables, and lean meats. AVS provided to patient today for education/recommendation on gender specific health and safety maintenance.  Return in about 1 year (around 04/24/2022) for CPE (30 min).   Orders Placed This Encounter  Procedures   CBC   Comprehensive metabolic panel   Hemoglobin A1c   Lipid panel   TSH    Hepatitis C antibody   Meds ordered this encounter  Medications   pantoprazole (PROTONIX) 40 MG tablet    Sig: Take 1 tablet (40 mg total) by mouth daily.    Dispense:  90 tablet    Refill:  3   amitriptyline (ELAVIL) 25 MG tablet    Sig: TAKE 1 TO 2 TABLETS BY MOUTH EVERY DAY FOR IBS    Dispense:  90 tablet    Refill:  3    Referral Orders  No referral(s) requested today     Note is dictated utilizing voice recognition software. Although note has been proof read prior to signing, occasional typographical errors still can be missed. If any questions arise, please do not hesitate to call for verification.  Electronically signed by: Felix Pacini, DO [facility] [place]

## 2021-04-24 LAB — HEPATITIS C ANTIBODY
Hepatitis C Ab: NONREACTIVE
SIGNAL TO CUT-OFF: 0.05 (ref <1.00)

## 2021-05-20 ENCOUNTER — Other Ambulatory Visit: Payer: Self-pay | Admitting: Family Medicine

## 2021-05-22 DIAGNOSIS — C44729 Squamous cell carcinoma of skin of left lower limb, including hip: Secondary | ICD-10-CM | POA: Diagnosis not present

## 2021-05-30 DIAGNOSIS — C44729 Squamous cell carcinoma of skin of left lower limb, including hip: Secondary | ICD-10-CM | POA: Diagnosis not present

## 2021-08-15 DIAGNOSIS — C44729 Squamous cell carcinoma of skin of left lower limb, including hip: Secondary | ICD-10-CM | POA: Diagnosis not present

## 2021-09-19 DIAGNOSIS — H35363 Drusen (degenerative) of macula, bilateral: Secondary | ICD-10-CM | POA: Diagnosis not present

## 2021-09-19 LAB — HM DIABETES EYE EXAM

## 2021-11-21 ENCOUNTER — Encounter: Payer: Self-pay | Admitting: Family Medicine

## 2021-11-21 ENCOUNTER — Ambulatory Visit (INDEPENDENT_AMBULATORY_CARE_PROVIDER_SITE_OTHER): Payer: BC Managed Care – PPO | Admitting: Family Medicine

## 2021-11-21 VITALS — BP 136/88 | HR 118 | Temp 97.8°F | Ht 69.0 in | Wt 280.0 lb

## 2021-11-21 DIAGNOSIS — J069 Acute upper respiratory infection, unspecified: Secondary | ICD-10-CM

## 2021-11-21 LAB — POCT RAPID STREP A (OFFICE): Rapid Strep A Screen: NEGATIVE

## 2021-11-21 LAB — POC COVID19 BINAXNOW: SARS Coronavirus 2 Ag: NEGATIVE

## 2021-11-21 NOTE — Progress Notes (Signed)
OFFICE VISIT  11/21/2021  CC:  Chief Complaint  Patient presents with   Congestion    Head and chest; started yesterday, used Mucinex   Sore Throat   Headache    All across forehead    Patient is a 41 y.o. male who presents for congestion.  HPI: Onset this AM, bad ST, nasal congesting/head pressure, HA, tired. No fever or sob. Mucinex this AM helped some.  Kids with resp sx's recently as well.  Past Medical History:  Diagnosis Date   GERD (gastroesophageal reflux disease)    Headache    occassionally   History of kidney stones    IBS (irritable bowel syndrome)    Metabolic syndrome 2018   Trigs, HDL, waist circ   Obesity, Class I, BMI 30-34.9    OSA (obstructive sleep apnea) 12/2018   CPAP recommended 12/2018   Wears contact lenses    Wears glasses     Past Surgical History:  Procedure Laterality Date   CYSTOSCOPY/URETEROSCOPY/HOLMIUM LASER/STENT PLACEMENT Right 03/31/2019   Procedure: CYSTOSCOPY RIGHT RETROGRAE PYELOGRAM /URETEROSCOPY HOLMIUM LASER/STENT PLACEMENT;  Surgeon: Bjorn Pippin, MD;  Location: [facility];  Service: Urology;  Laterality: Right;   LEG SURGERY Right    MULTIPLE TOOTH EXTRACTIONS     THYROGLOSSAL DUCT CYST Left 03/16/2015   Procedure: Excision Left neck cyst;  Surgeon: Christia Reading, MD;  Location: [facility] OR;  Service: ENT;  Laterality: Left;    Outpatient Medications Prior to Visit  Medication Sig Dispense Refill   amitriptyline (ELAVIL) 25 MG tablet TAKE 1 TO 2 TABLETS BY MOUTH EVERY DAY FOR IRRITABLE BOWEL SYNDROME 180 tablet 3   b complex vitamins tablet Take 1 tablet by mouth daily.     cetirizine (ZYRTEC) 10 MG tablet Take 10 mg by mouth daily.     Multiple Vitamin (MULTIVITAMIN) tablet Take 1 tablet by mouth daily.     pantoprazole (PROTONIX) 40 MG tablet Take 1 tablet (40 mg total) by mouth daily. 90 tablet 3   vitamin B-12 (CYANOCOBALAMIN) 500 MCG tablet Take by mouth.     Facility-Administered Medications Prior to Visit   Medication Dose Route Frequency Provider Last Rate Last Admin   iohexol (OMNIPAQUE) 300 MG/ML solution 75 mL  75 mL Intravenous Once PRN Radiologist, Medication, MD        Allergies  Allergen Reactions   Compazine [Prochlorperazine Edisylate] Anaphylaxis    ROS As per HPI  PE:    11/21/2021    4:09 PM 04/23/2021    8:05 AM 09/25/2020   11:29 AM  Vitals with BMI  Height 5\' 9"  5\' 9"  5\' 10"   Weight 280 lbs 270 lbs 276 lbs  BMI 41.33 39.85 39.6  Systolic 136 130  Diastolic 88 84 81  Pulse 118 91 102   Physical Exam  VS: noted--normal. Gen: alert, NAD, NONTOXIC APPEARING. HEENT: eyes without injection, drainage, or swelling.  Ears: EACs clear, TMs with normal light reflex and landmarks.  Nose: Clear rhinorrhea, with some dried, crusty exudate adherent to mildly injected mucosa.  No purulent d/c.  No paranasal sinus TTP.  No facial swelling.  Throat and mouth without focal lesion.  No pharyngial swelling, erythema, or exudate.   Neck: supple, no LAD.   LUNGS: CTA bilat, nonlabored resps.   CV: RRR, no m/r/g. EXT: no c/c/e SKIN: no rash   LABS:  Last CBC Lab Results  Component Value Date   WBC 9.7 04/23/2021   HGB 13.6 04/23/2021   HCT 43.0  04/23/2021   MCV 84.2 04/23/2021   MCH 26.8 (L) 06/15/2017   RDW 14.7 04/23/2021   PLT 232.0 04/23/2021   Last metabolic panel Lab Results  Component Value Date   GLUCOSE 78 04/23/2021   NA 138 04/23/2021   K 4.3 04/23/2021   CL 104 04/23/2021   CO2 26 04/23/2021   BUN 7 04/23/2021   CREATININE 0.68 04/23/2021   GFRNONAA >60 03/12/2015   CALCIUM 8.8 04/23/2021   PROT 6.8 04/23/2021   ALBUMIN 4.1 04/23/2021   BILITOT 0.5 04/23/2021   ALKPHOS 69 04/23/2021   AST 19 04/23/2021   ALT 32 04/23/2021   ANIONGAP 13 03/12/2015   Last hemoglobin A1c Lab Results  Component Value Date   HGBA1C 5.9 04/23/2021   IMPRESSION AND PLAN:  URI, minimal cough.  Viral etiology highly suspected. Rapid strep and covid tests today  NEG. Reassured. Symptomatic care reviewed.  An After Visit Summary was printed and given to the patient.  FOLLOW UP: Return if symptoms worsen or fail to improve.  Signed:  Santiago Bumpers, MD           11/21/2021

## 2021-11-21 NOTE — Patient Instructions (Signed)
Nice to meet you.  Drink lots of water and gatorade zero.  Over the counter med such as mucinex may be helpful. Use saline nasal spray every 6 hours.

## 2021-11-29 DIAGNOSIS — J069 Acute upper respiratory infection, unspecified: Secondary | ICD-10-CM | POA: Diagnosis not present

## 2021-12-04 ENCOUNTER — Encounter: Payer: Self-pay | Admitting: Family Medicine

## 2021-12-04 ENCOUNTER — Ambulatory Visit (INDEPENDENT_AMBULATORY_CARE_PROVIDER_SITE_OTHER): Payer: BC Managed Care – PPO | Admitting: Family Medicine

## 2021-12-04 VITALS — BP 137/81 | HR 94 | Temp 98.1°F | Ht 69.0 in | Wt 284.0 lb

## 2021-12-04 DIAGNOSIS — R7309 Other abnormal glucose: Secondary | ICD-10-CM | POA: Diagnosis not present

## 2021-12-04 DIAGNOSIS — Z716 Tobacco abuse counseling: Secondary | ICD-10-CM

## 2021-12-04 DIAGNOSIS — Z713 Dietary counseling and surveillance: Secondary | ICD-10-CM | POA: Diagnosis not present

## 2021-12-04 DIAGNOSIS — Z6841 Body Mass Index (BMI) 40.0 and over, adult: Secondary | ICD-10-CM

## 2021-12-04 DIAGNOSIS — F17210 Nicotine dependence, cigarettes, uncomplicated: Secondary | ICD-10-CM

## 2021-12-04 DIAGNOSIS — E1169 Type 2 diabetes mellitus with other specified complication: Secondary | ICD-10-CM

## 2021-12-04 DIAGNOSIS — G4733 Obstructive sleep apnea (adult) (pediatric): Secondary | ICD-10-CM

## 2021-12-04 DIAGNOSIS — E669 Obesity, unspecified: Secondary | ICD-10-CM

## 2021-12-04 DIAGNOSIS — K219 Gastro-esophageal reflux disease without esophagitis: Secondary | ICD-10-CM

## 2021-12-04 DIAGNOSIS — E662 Morbid (severe) obesity with alveolar hypoventilation: Secondary | ICD-10-CM | POA: Diagnosis not present

## 2021-12-04 LAB — HEMOGLOBIN A1C: Hgb A1c MFr Bld: 6.3 % (ref 4.6–6.5)

## 2021-12-04 MED ORDER — NALTREXONE HCL 50 MG PO TABS: 25.0000 mg | ORAL_TABLET | Freq: Two times a day (BID) | ORAL | 5 refills | Status: DC

## 2021-12-04 MED ORDER — BUPROPION HCL ER (XL) 150 MG PO TB24: 150.0000 mg | ORAL_TABLET | Freq: Every day | ORAL | 1 refills | Status: DC

## 2021-12-04 NOTE — Progress Notes (Addendum)
Caleb Norris , 05/23/1981, 41 y.o., male MRN: [id number] Caleb Norris Care Team    Relationship Specialty Notifications Start End  Natalia LeatherwoodKuneff, Federico Maiorino A, DO PCP - General Family Medicine  02/04/19   Waymon BudgeYoung, Clinton D, MD Consulting Physician Pulmonary Disease  01/04/19   Christia ReadingBates, Dwight, MD Consulting Physician Otolaryngology  02/04/19     Chief Complaint  Caleb Norris presents with   Obesity    Caleb Norris is not fasting   Nicotine Dependence     Subjective: Caleb Norris presents for an OV to obtain assistance to stop smoking and weight loss.  Smoking cessation: Caleb Norris reports Caleb Norris has smoked for about 11 years total of his life.  Caleb Norris smoked for 10 years about 1-1/2 packs a day of marble lights.  Caleb Norris then quit smoking for about 10 years, recently restarted about a year ago.  Caleb Norris states for the last year Caleb Norris smoked about a pack a day of marble lights.  Caleb Norris states Caleb Norris restarted smoking when Caleb Norris was going through a stressful time in his life.  Caleb Norris has never used medications, patches or other nicotine replacements in the past.  Elevated glucose-morbid obesity: Caleb Norris has had a history of elevated glucose.  A1c today resulted with elevated A1c 6.3.  Caleb Norris's BMI is 41.94 Caleb Norris has serious comorbidities of OSA, GERD. Caleb Norris reports Caleb Norris frequently will skip dinner.  Caleb Norris Caleb Norris prefers quick meals for his children when Caleb Norris has them in the evening.  Caleb Norris sometimes will have a snack in the evening before bed.  His breakfast consists of a [employer] biscuit sausage egg and cheese.  His lunch typically consists of a fast food meal.  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  Caleb Norris then drives an hour to and from work.  Daily life is rather sedentary.  Caleb Norris enjoys golfing on the weekends.  Caleb Norris does not exercise.     04/23/2021    8:04 AM 05/07/2020    9:32 AM 02/04/2019    8:08 AM 03/16/2017    2:51 PM  Depression screen PHQ 2/9  Decreased Interest 0 0 0 0  Down, Depressed, Hopeless 0  0 0  PHQ - 2 Score 0 0 0 0    Allergies   Allergen Reactions   Compazine [Prochlorperazine Edisylate] Anaphylaxis   Social History   Social History Narrative   Married, 1 son and 1 daughter.   Educ: [employer] degree.   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   Tob: former, 15 pack-yr hx.  Quit 2011.   Alcohol: occasional.   Drugs: none.   Past Medical History:  Diagnosis Date   GERD (gastroesophageal reflux disease)    Headache    occassionally   History of kidney stones    IBS (irritable bowel syndrome)    Metabolic syndrome 2018   Trigs, HDL, waist circ   Obesity, Class I, BMI 30-34.9    OSA (obstructive sleep apnea) 12/2018   CPAP recommended 12/2018   Wears contact lenses    Wears glasses    Past Surgical History:  Procedure Laterality Date   CYSTOSCOPY/URETEROSCOPY/HOLMIUM LASER/STENT PLACEMENT Right 03/31/2019   Procedure: CYSTOSCOPY RIGHT RETROGRAE PYELOGRAM /URETEROSCOPY HOLMIUM LASER/STENT PLACEMENT;  Surgeon: Bjorn PippinWrenn, John, MD;  [facility] [place];  Service: Urology;  Laterality: Right;   LEG SURGERY Right    MULTIPLE TOOTH EXTRACTIONS     THYROGLOSSAL DUCT CYST Left 03/16/2015   Procedure: Excision Left neck cyst;  Surgeon: Christia Readingwight Bates, MD;  [facility]  OR;  Service: ENT;  Laterality: Left;   Family History  Problem Relation Age of Onset   Hyperlipidemia Father    Hypertension Father    Heart disease Maternal Grandfather    Hyperlipidemia Maternal Grandfather    Hypertension Maternal Grandfather    Heart attack Maternal Grandfather    Stroke Neg Hx    Diabetes Neg Hx    Cancer Neg Hx    Allergies as of 12/04/2021       Reactions   Compazine [prochlorperazine Edisylate] Anaphylaxis        Medication List        Accurate as of December 04, 2021 11:59 PM. If you have any questions, ask your nurse or doctor.          amitriptyline 25 MG tablet Commonly known as: ELAVIL TAKE 1 TO 2 TABLETS BY MOUTH EVERY DAY FOR IRRITABLE BOWEL SYNDROME   b complex vitamins  tablet Take 1 tablet by mouth daily.   buPROPion 150 MG 24 hr tablet Commonly known as: Wellbutrin XL Take 1 tablet (150 mg total) by mouth daily. Started by: Felix Pacini, DO   cetirizine 10 MG tablet Commonly known as: ZYRTEC Take 10 mg by mouth daily.   multivitamin tablet Take 1 tablet by mouth daily.   naltrexone 50 MG tablet Commonly known as: DEPADE Take 0.5 tablets (25 mg total) by mouth 2 (two) times daily. With meal Started by: Felix Pacini, DO   pantoprazole 40 MG tablet Commonly known as: PROTONIX Take 1 tablet (40 mg total) by mouth daily.   tirzepatide 2.5 MG/0.5ML Pen Commonly known as: MOUNJARO Inject 2.5 mg into the skin once a week. Started by: Felix Pacini, DO   vitamin B-12 500 MCG tablet Commonly known as: CYANOCOBALAMIN Take by mouth.        All past medical history, surgical history, allergies, family history, immunizations andmedications were updated in the EMR today and reviewed under the history and medication portions of Caleb Norris EMR.     ROS Negative, with the exception of above mentioned in HPI   Objective:  BP 137/81   Pulse 94   Temp 98.1 F (36.7 C) (Oral)   Ht 5\' 9"  (1.753 m)   Wt 284 lb (128.8 kg)   SpO2 96%   BMI 41.94 kg/m  Body mass index is 41.94 kg/m. Physical Exam Vitals and nursing note reviewed.  Constitutional:      General: Caleb Norris is not in acute distress.    Appearance: Normal appearance. Caleb Norris is obese. Caleb Norris is not ill-appearing, toxic-appearing or diaphoretic.  HENT:     Head: Normocephalic and atraumatic.  Eyes:     General: No scleral icterus.       Right eye: No discharge.        Left eye: No discharge.     Extraocular Movements: Extraocular movements intact.     Pupils: Pupils are equal, round, and reactive to light.  Cardiovascular:     Rate and Rhythm: Normal rate and regular rhythm.     Heart sounds: No murmur heard. Pulmonary:     Effort: Pulmonary effort is normal. No respiratory distress.     Breath  sounds: Normal breath sounds. No wheezing, rhonchi or rales.  Musculoskeletal:     Cervical back: Neck supple.     Right lower leg: No edema.     Left lower leg: No edema.  Lymphadenopathy:     Cervical: No cervical adenopathy.  Skin:    General: Skin is  warm and dry.     Coloration: Skin is not jaundiced or pale.     Findings: No rash.  Neurological:     Mental Status: Caleb Norris is alert and oriented to person, place, and time. Mental status is at baseline.  Psychiatric:        Mood and Affect: Mood normal.        Behavior: Behavior normal.        Thought Content: Thought content normal.        Judgment: Judgment normal.     No results found. No results found. No results found for this or any previous visit (from the past 24 hour(s)).  Assessment/Plan: FITZ MATSUO is a 41 y.o. male present for OV for  Obesity with alveolar hypoventilation and body mass index (BMI) of 40 or greater (HCC)/Weight loss counseling, encounter for/Morbid obesity with BMI of 40.0-44.9, adult (HCC) Caleb Norris on exercise, calorie counting, weight loss and potential medications to help with weight loss today. -Caleb Norris was provided with online resources for: Weekly net calorie calculator.  Applications for calorie counting.  Caleb Norris was advised to ensure she is taking in adequate nutrition daily by meeting calorie goals. -Caleb Norris was educated on dietary changes to not only lose weight but to eat healthy.  - - Caleb Norris was educated on glycemic index and foods to omit from his diet. -Caleb Norris was educated on exercise goal of 150 minutes a week (plus warm up and cool down) of cardiovascular exercise. -Caleb Norris was encouraged to maintain adequate water consumption of at least 120 ounces a day, more if exercising/sweating. -Caleb Norris was encouraged to meal prep.  Consider online meal subscriptions that are low in carbohydrate. -Start Wellbutrin 150 mg twice daily -Start Depade half a tablet twice daily  before meals  Type 2 diabetes mellitus with other specified complication, without long-term current use of insulin (HCC) Caleb Norris has had a history of elevated glucose, A1c is elevated. With his history of IBS metformin is not ideal. Caleb Norris is interested in weekly injections to treat his elevated A1c.  Caleb Norris may receive benefit in weight loss with medication as well.  This would be helpful for him to decrease his serious comorbid conditions of GERD and OSA. Discussed diabetic diet and exercise - Hemoglobin A1c>6.3 - tirzepatide (MOUNJARO) 2.5 MG/0.5ML Pen; Inject 2.5 mg into the skin once a week.  Dispense: 2 mL; Refill: 5  OSA (obstructive sleep apnea) Weight loss encouraged to help improve condition.  Gastroesophageal reflux disease without esophagitis Weight loss encouraged to help improve symptoms. Continue Protonix 40 mg daily  Cigarette nicotine dependence without complication/Encounter for smoking cessation counseling Caleb Norris was asked about smoking history. Caleb Norris were advised  to quit smoking in a clear, strong and personalized manner. Caleb Norris willingness to quit smoking was assessed and felt to be in (preparation phase. Caleb Norris were offered assistance in cessation and options were explained to them today.  Caleb Norris decided to start Wellbutrin 150 mg twice daily. Caleb Norris was advised to start Wellbutrin and pick a quit date approximately 10-14 days after starting medication.  The night before quit date Caleb Norris is to ensure there is no nicotine products in the home.  In place first OTC nicotine patch (14 milligram) on arm. Angel patch every 24 hours decreasing to 7 mg patch within 1 to 2 weeks.  Eventually tapering off 7 mg to the nicotine gum if necessary and an additional 1 to 2 weeks. Caleb Norris advised to follow up 4 to 6 weeks  Reviewed expectations  re: course of current medical issues. Discussed self-management of symptoms. Outlined signs and symptoms indicating need for more acute intervention. Caleb Norris  verbalized understanding and all questions were answered. Caleb Norris received an After-Visit Summary.    Orders Placed This Encounter  Procedures   Hemoglobin A1c   Meds ordered this encounter  Medications   buPROPion (WELLBUTRIN XL) 150 MG 24 hr tablet    Sig: Take 1 tablet (150 mg total) by mouth daily.    Dispense:  90 tablet    Refill:  1   naltrexone (DEPADE) 50 MG tablet    Sig: Take 0.5 tablets (25 mg total) by mouth 2 (two) times daily. With meal    Dispense:  30 tablet    Refill:  5   tirzepatide (MOUNJARO) 2.5 MG/0.5ML Pen    Sig: Inject 2.5 mg into the skin once a week.    Dispense:  2 mL    Refill:  5   Referral Orders  No referral(s) requested today   45 minutes spent providing Caleb Norris nutrition counseling, exercise counseling, diabetic counseling/education, smoking cessation evaluation/education/counseling and treatment plan for all the above.   Note is dictated utilizing voice recognition software. Although note has been proof read prior to signing, occasional typographical errors still can be missed. If any questions arise, please do not hesitate to call for verification.   electronically signed by:  Felix Pacini, DO  [facility] - OR

## 2021-12-04 NOTE — Patient Instructions (Signed)
Water - at least 100 ounces a day.  Avoid 5 whites. Follow lean meat and glycemic index.   Check on pre-prepared meals and hello fresh like online meals.  Increase exercise.  Start wellbutrin and depade.  Start Wellbutrin and then make your quit date about 10 days later. Throw away all cigarettes the day before. Place first patch on that night.   Patches start 14 mg for 1-2 weeks, then 7 1-2 weeks.

## 2021-12-05 ENCOUNTER — Telehealth: Payer: Self-pay | Admitting: Family Medicine

## 2021-12-05 ENCOUNTER — Encounter: Payer: Self-pay | Admitting: Family Medicine

## 2021-12-05 DIAGNOSIS — E119 Type 2 diabetes mellitus without complications: Secondary | ICD-10-CM | POA: Insufficient documentation

## 2021-12-05 DIAGNOSIS — F17210 Nicotine dependence, cigarettes, uncomplicated: Secondary | ICD-10-CM | POA: Insufficient documentation

## 2021-12-05 DIAGNOSIS — Z716 Tobacco abuse counseling: Secondary | ICD-10-CM | POA: Insufficient documentation

## 2021-12-05 MED ORDER — TIRZEPATIDE 2.5 MG/0.5ML ~~LOC~~ SOAJ: 2.5000 mg | SUBCUTANEOUS | 5 refills | Status: DC

## 2021-12-05 NOTE — Telephone Encounter (Signed)
Please inform patient his A1c is 6.3  This is in the diabetic range. I have called in a prescription called in [facility], which is a once weekly injection.  Hopefully, his insurance will cover this medication to treat the diabetes and it will also help him on his weight loss journey.  Once he receives the medication, I would encourage him to come in for a nurse visit for tutorial on proper use/injection technique.  Follow-up with this provider in 4-6 weeks.

## 2021-12-05 NOTE — Telephone Encounter (Signed)
Spoke with pt regarding labs and instructions.   

## 2021-12-09 ENCOUNTER — Telehealth: Payer: BC Managed Care – PPO

## 2021-12-09 NOTE — Telephone Encounter (Signed)
Patient was told by pharmacy that prior authorization is required for [facility].  I told patient Dr. Claiborne Billings is out of office today.  I also told him PA could take up to 2 weeks to get approved.  He understood.  tirzepatide [facility]) 2.5 MG/0.5ML Pen [[id number]]

## 2021-12-09 NOTE — Telephone Encounter (Signed)
Caleb Norris (Key: [id number]) [phone] Mounjaro 2.5MG /0.5ML pen-injectors Status: PA Response - ApprovedCreated: June 15th, 2023 [phone]: June 19th, 2023

## 2021-12-11 ENCOUNTER — Ambulatory Visit: Payer: BC Managed Care – PPO

## 2022-01-08 ENCOUNTER — Telehealth: Payer: Self-pay

## 2022-01-08 NOTE — Telephone Encounter (Signed)
Medication has a quantity limit of 2 ml per 180 days. Pt is currently taking 2 mL per 30 days.

## 2022-01-08 NOTE — Telephone Encounter (Signed)
Caleb Norris Key: [id number] - Rx #: [id number]  Outcome Approvedon June 19 Effective from 12/09/2021 through 12/08/2022. Drug Mounjaro 2.5MG /0.5ML pen-injectors Form Consulting civil engineer Form (CB)

## 2022-01-08 NOTE — Telephone Encounter (Signed)
Pharmacy is telling patient insurance will not cover his medication.  They were not specific as to why, all he said was something about 180 days.  I advised him to call insurance to see why they are rejecting to pay.  Please call patient to follow up and possibly call [facility] to check denial message.  Possibly dosage or days is incorrect? Patient can be reached at [phone]

## 2022-01-08 NOTE — Telephone Encounter (Signed)
Called pt to explain quantity limit. Pt will call insurance to see if another PA can be completed for the quantity limit

## 2022-01-08 NOTE — Telephone Encounter (Signed)
Will call pt to clarify which medication then will call pharmacy to gain a better understanding what is needed for medication approval for insurance.

## 2022-01-08 NOTE — Telephone Encounter (Signed)
Patient called insurance company, please start PA for quantity limit for [facility] Patient is taking 4 injections per month.   Patient can be reached at [phone]

## 2022-01-08 NOTE — Telephone Encounter (Signed)
Called  pt to ask which medication was he referring to pt stated pharmacy is denying mounjaro

## 2022-01-09 ENCOUNTER — Other Ambulatory Visit: Payer: Self-pay

## 2022-01-09 DIAGNOSIS — E662 Morbid (severe) obesity with alveolar hypoventilation: Secondary | ICD-10-CM

## 2022-01-09 DIAGNOSIS — R7309 Other abnormal glucose: Secondary | ICD-10-CM

## 2022-01-09 NOTE — Telephone Encounter (Signed)
Pharmacy will fax PA 

## 2022-01-13 NOTE — Telephone Encounter (Signed)
PA received Friday

## 2022-01-13 NOTE — Telephone Encounter (Signed)
Patient states he spoke to his insurance company and they have not rec'd prior auth request.  Please follow up with patient [phone]

## 2022-01-13 NOTE — Telephone Encounter (Signed)
Sheehan Polinsky Key: [id number] Outcome Approved today Effective from 01/13/2022 through 01/12/2023. Drug Mounjaro 2.5MG /0.5ML pen-injectors Form Consulting civil engineer Form (CB)

## 2022-01-16 ENCOUNTER — Other Ambulatory Visit: Payer: Self-pay | Admitting: Family Medicine

## 2022-01-16 DIAGNOSIS — R7309 Other abnormal glucose: Secondary | ICD-10-CM

## 2022-01-16 DIAGNOSIS — E662 Morbid (severe) obesity with alveolar hypoventilation: Secondary | ICD-10-CM

## 2022-01-16 MED ORDER — TIRZEPATIDE 5 MG/0.5ML ~~LOC~~ SOAJ: 5.0000 mg | SUBCUTANEOUS | 2 refills | Status: DC

## 2022-01-16 NOTE — Telephone Encounter (Signed)
Patient inquiring about appt scheduled for next week.  Should he rescheduled for a couple of weeks or month to follow up on new dose of Mounjaro.  Please advise.  I told patient I would call him back to advise on appt info

## 2022-01-16 NOTE — Telephone Encounter (Signed)
Spoke with pt and informed him that Dr. Claiborne Billings has sent the medication. Pt has received notification that medication has been approved.

## 2022-01-16 NOTE — Telephone Encounter (Signed)
Insurance will not cover med unless he titrate up to next dose. Pt has appt on 08/02 but has been out of meds. Please advise.

## 2022-01-16 NOTE — Telephone Encounter (Signed)
Pt notified to keep scheduled appt.

## 2022-01-16 NOTE — Telephone Encounter (Signed)
Pt was notified 07/24

## 2022-01-16 NOTE — Telephone Encounter (Signed)
Spoke with pharmacy and gave the cover my meds key. They stated that they will call insurance.

## 2022-01-16 NOTE — Telephone Encounter (Signed)
Patient calling to check on prior auth.  Prior Berkley Harvey was approved on 7/24.  Patient has not been notified of approval.  I dont see where medication was called in, however, I told patient to check with [facility] to make sure they have medication in stock.  Please call patient [phone]

## 2022-01-16 NOTE — Telephone Encounter (Signed)
Patient called back regarding Mounjaro prescription, he said that pharmacy called him to let him know that insurance is still denying.  I told him that Dr. Claiborne Billings will titrate up to the next dose which is 0.5mg  weekly injection. He will contact his pharmacy to inquire about pick up time.

## 2022-01-16 NOTE — Telephone Encounter (Signed)
I am sorry he is having such a hard time from his insurance company.   Assuming he is tolerating the initial dose of Mounjaro, we will titrate up to the next dose which is 0.5 mg injection weekly.  I did call this into his pharmacy.

## 2022-01-22 ENCOUNTER — Ambulatory Visit (INDEPENDENT_AMBULATORY_CARE_PROVIDER_SITE_OTHER): Payer: BC Managed Care – PPO | Admitting: Family Medicine

## 2022-01-22 ENCOUNTER — Encounter: Payer: Self-pay | Admitting: Family Medicine

## 2022-01-22 VITALS — BP 128/88 | HR 95 | Temp 98.5°F | Ht 69.0 in | Wt 260.2 lb

## 2022-01-22 DIAGNOSIS — G4733 Obstructive sleep apnea (adult) (pediatric): Secondary | ICD-10-CM

## 2022-01-22 DIAGNOSIS — E1169 Type 2 diabetes mellitus with other specified complication: Secondary | ICD-10-CM | POA: Diagnosis not present

## 2022-01-22 DIAGNOSIS — K580 Irritable bowel syndrome with diarrhea: Secondary | ICD-10-CM

## 2022-01-22 DIAGNOSIS — F17210 Nicotine dependence, cigarettes, uncomplicated: Secondary | ICD-10-CM | POA: Diagnosis not present

## 2022-01-22 DIAGNOSIS — K219 Gastro-esophageal reflux disease without esophagitis: Secondary | ICD-10-CM | POA: Diagnosis not present

## 2022-01-22 DIAGNOSIS — E662 Morbid (severe) obesity with alveolar hypoventilation: Secondary | ICD-10-CM

## 2022-01-22 DIAGNOSIS — Z716 Tobacco abuse counseling: Secondary | ICD-10-CM

## 2022-01-22 DIAGNOSIS — Z6841 Body Mass Index (BMI) 40.0 and over, adult: Secondary | ICD-10-CM

## 2022-01-22 LAB — MICROALBUMIN / CREATININE URINE RATIO
Creatinine,U: 161.4 mg/dL
Microalb Creat Ratio: 1.3 mg/g (ref 0.0–30.0)
Microalb, Ur: 2.1 mg/dL — ABNORMAL HIGH (ref 0.0–1.9)

## 2022-01-22 MED ORDER — TIRZEPATIDE 7.5 MG/0.5ML ~~LOC~~ SOAJ: 7.5000 mg | SUBCUTANEOUS | 5 refills | Status: DC

## 2022-01-22 NOTE — Progress Notes (Signed)
Caleb Norris , Jul 11, 1980, 41 y.o., male MRN: [id number] Patient Care Team    Relationship Specialty Notifications Start End  Natalia Leatherwood, DO PCP - General Family Medicine  02/04/19   Waymon Budge, MD Consulting Physician Pulmonary Disease  01/04/19   Christia Reading, MD Consulting Physician Otolaryngology  02/04/19     Chief Complaint  Patient presents with   Weight Check    Pt is fasting     Subjective: Pt presents for an OV to obtain assistance to stop smoking and weight loss.  Smoking cessation: Patient reports he has smoked for about 11 years total of his life.  He smoked for 10 years about 1-1/2 packs a day of marble lights.  He then quit smoking for about 10 years, recently restarted about a year ago.  He states for the last year he smoked about a pack a day of marble lights.  He states he restarted smoking when he was going through a stressful time in his life.  He has never used medications, patches or other nicotine replacements in the past. He is tolerating the Wellbutrin and Depade.  He states he has not quit smoking yet.  He is cut back by half.  He is looking into a program through his insurance that will pay for his nicotine patches.  Diabetes-obesity: Last eA1c 6.3.   he has serious comorbidities of OSA, GERD. She reports today he has been doing well.  He has been able to lose weight and feels good about his weight loss so far.  He is tolerating the [facility], now up to 5 mg weekly injection without any side effects.  He is making alterations to his diet cutting back on the sugar content and the complex carbohydrate content.  He is yet to start exercising. Daily life is rather sedentary.  He enjoys golfing on the weekends.    Vitals - 1 value per visit     Weight (lb) Height BMI  11/21/2021 280  5\' 9"  (1.753 m)  41.35 kg/m2   12/04/2021 284  5\' 9"  (1.753 m)  41.94 kg/m2   01/22/2022 260.2  5\' 9"  (1.753 m)  38.42 kg/m2        04/23/2021    8:04 AM 05/07/2020     9:32 AM 02/04/2019    8:08 AM 03/16/2017    2:51 PM  Depression screen PHQ 2/9  Decreased Interest 0 0 0 0  Down, Depressed, Hopeless 0  0 0  PHQ - 2 Score 0 0 0 0    Allergies  Allergen Reactions   Compazine [Prochlorperazine Edisylate] Anaphylaxis   Social History   Social History Narrative   Married, 1 son and 1 daughter.   Educ: [employer] degree.   Occupation: 02/06/2019 for 03/18/2017.   Tob: former, 15 pack-yr hx.  Quit 2011.   Alcohol: occasional.   Drugs: none.   Past Medical History:  Diagnosis Date   GERD (gastroesophageal reflux disease)    Headache    occassionally   History of kidney stones    IBS (irritable bowel syndrome)    Metabolic syndrome 2018   Trigs, HDL, waist circ   Obesity, Class I, BMI 30-34.9    OSA (obstructive sleep apnea) 12/2018   CPAP recommended 12/2018   Wears contact lenses    Wears glasses    Past Surgical History:  Procedure Laterality Date   CYSTOSCOPY/URETEROSCOPY/HOLMIUM LASER/STENT PLACEMENT Right 03/31/2019   Procedure: CYSTOSCOPY RIGHT RETROGRAE PYELOGRAM /  URETEROSCOPY HOLMIUM LASER/STENT PLACEMENT;  Surgeon: Bjorn Pippin, MD;  Location: [facility];  Service: Urology;  Laterality: Right;   LEG SURGERY Right    MULTIPLE TOOTH EXTRACTIONS     THYROGLOSSAL DUCT CYST Left 03/16/2015   Procedure: Excision Left neck cyst;  Surgeon: Christia Reading, MD;  Location: [facility] OR;  Service: ENT;  Laterality: Left;   Family History  Problem Relation Age of Onset   Hyperlipidemia Father    Hypertension Father    Heart disease Maternal Grandfather    Hyperlipidemia Maternal Grandfather    Hypertension Maternal Grandfather    Heart attack Maternal Grandfather    Stroke Neg Hx    Diabetes Neg Hx    Cancer Neg Hx    Allergies as of 01/22/2022       Reactions   Compazine [prochlorperazine Edisylate] Anaphylaxis        Medication List        Accurate as of January 22, 2022  1:04 PM. If you have any questions, ask  your nurse or doctor.          STOP taking these medications    tirzepatide 5 MG/0.5ML Pen Commonly known as: MOUNJARO Replaced by: tirzepatide 7.5 MG/0.5ML Pen Stopped by: Felix Pacini, DO       TAKE these medications    amitriptyline 25 MG tablet Commonly known as: ELAVIL TAKE 1 TO 2 TABLETS BY MOUTH EVERY DAY FOR IRRITABLE BOWEL SYNDROME   b complex vitamins tablet Take 1 tablet by mouth daily.   buPROPion 150 MG 24 hr tablet Commonly known as: Wellbutrin XL Take 1 tablet (150 mg total) by mouth daily.   cetirizine 10 MG tablet Commonly known as: ZYRTEC Take 10 mg by mouth daily.   multivitamin tablet Take 1 tablet by mouth daily.   naltrexone 50 MG tablet Commonly known as: DEPADE Take 0.5 tablets (25 mg total) by mouth 2 (two) times daily. With meal   pantoprazole 40 MG tablet Commonly known as: PROTONIX Take 1 tablet (40 mg total) by mouth daily.   tirzepatide 7.5 MG/0.5ML Pen Commonly known as: MOUNJARO Inject 7.5 mg into the skin once a week. Replaces: tirzepatide 5 MG/0.5ML Pen Started by: Felix Pacini, DO   vitamin B-12 500 MCG tablet Commonly known as: CYANOCOBALAMIN Take by mouth.        All past medical history, surgical history, allergies, family history, immunizations andmedications were updated in the EMR today and reviewed under the history and medication portions of their EMR.     ROS Negative, with the exception of above mentioned in HPI   Objective:  BP 128/88   Pulse 95   Temp 98.5 F (36.9 C)   Ht 5\' 9"  (1.753 m)   Wt 260 lb 3.2 oz (118 kg)   SpO2 98%   BMI 38.42 kg/m  Body mass index is 38.42 kg/m. Physical Exam Vitals and nursing note reviewed.  Constitutional:      General: He is not in acute distress.    Appearance: Normal appearance. He is not ill-appearing, toxic-appearing or diaphoretic.  HENT:     Head: Normocephalic and atraumatic.  Eyes:     General: No scleral icterus.       Right eye: No discharge.         Left eye: No discharge.     Extraocular Movements: Extraocular movements intact.     Pupils: Pupils are equal, round, and reactive to light.  Cardiovascular:     Rate and  Rhythm: Normal rate and regular rhythm.  Pulmonary:     Effort: Pulmonary effort is normal.     Breath sounds: Normal breath sounds.  Musculoskeletal:     Right lower leg: No edema.     Left lower leg: No edema.  Skin:    General: Skin is warm and dry.     Coloration: Skin is not jaundiced or pale.     Findings: No rash.  Neurological:     Mental Status: He is alert and oriented to person, place, and time. Mental status is at baseline.  Psychiatric:        Mood and Affect: Mood normal.        Behavior: Behavior normal.        Thought Content: Thought content normal.        Judgment: Judgment normal.    Diabetic Foot Exam - Simple   Simple Foot Form Diabetic Foot exam was performed with the following findings: Yes 01/22/2022  8:46 AM  Visual Inspection No deformities, no ulcerations, no other skin breakdown bilaterally: Yes Sensation Testing Pulse Check Posterior Tibialis and Dorsalis pulse intact bilaterally: Yes Comments      No results found. No results found. No results found for this or any previous visit (from the past 24 hour(s)).  Assessment/Plan: KIERNAN ATKERSON is a 41 y.o. male present for OV for  Obesity with alveolar hypoventilation and body mass index (BMI) of 38 o/Weight loss counseling, encounter for/Morbid obesity with BMI of 40.0-44.9, adult [facility]) Patient has been counseled on exercise, calorie counting, weight loss and potential medications to help with weight loss today. -Patient has been provided with online resources for: Weekly net calorie calculator.  Applications for calorie counting.  Patient was advised to ensure she is taking in adequate nutrition daily by meeting calorie goals. -Patient has been educated on dietary changes to not only lose weight but to eat healthy.    - Patient has been educated on glycemic index and foods to omit from his diet. -Patient has been educated on exercise goal of 150 minutes a week (plus warm up and cool down) of cardiovascular exercise.  He needs to work on exercise regimen as his goal before next appointment. -Patient has been encouraged to maintain adequate water consumption of at least 120 ounces a day, more if exercising/sweating. -Patient was encouraged to meal prep.  Consider online meal subscriptions that are low in carbohydrate. - Continue Wellbutrin 150 mg twice daily - Continue Depade half a tablet twice daily before meals  Type 2 diabetes mellitus with other specified complication, without long-term current use of insulin (HCC) With his history of IBS metformin is not ideal. He is interested in weekly injections to treat his elevated A1c.  He may receive benefit in weight loss with medication as well.  This would be helpful for him to decrease his serious comorbid conditions of GERD and OSA. Discussed diabetic diet and exercise - Hemoglobin A1c>6.3 -Continue Mounjaro 5 mg until current prescription is completed, then increase to Mounjaro 7.5 mg daily  OSA (obstructive sleep apnea) Weight loss encouraged to help improve condition.  Gastroesophageal reflux disease without esophagitis Weight loss encouraged to help improve symptoms. Continue Protonix 40 mg daily  Cigarette nicotine dependence without complication/Encounter for smoking cessation counseling Patient was encouraged to take the next step and pick a quit date. He hopefully will be getting his nicotine patches through his insurance company program Continue Wellbutrin  Reviewed expectations re: course of current medical issues.  Discussed self-management of symptoms. Outlined signs and symptoms indicating need for more acute intervention. Patient verbalized understanding and all questions were answered. Patient received an After-Visit  Summary.    Orders Placed This Encounter  Procedures   Microalbumin / creatinine urine ratio   Meds ordered this encounter  Medications   tirzepatide (MOUNJARO) 7.5 MG/0.5ML Pen    Sig: Inject 7.5 mg into the skin once a week.    Dispense:  2 mL    Refill:  5   Referral Orders  No referral(s) requested today     Note is dictated utilizing voice recognition software. Although note has been proof read prior to signing, occasional typographical errors still can be missed. If any questions arise, please do not hesitate to call for verification.   electronically signed by:  Felix Pacini, DO  [facility] [place]

## 2022-01-22 NOTE — Patient Instructions (Addendum)
GREAT job!!! After completing current script, increase to mounjaro 7.5 mg weekly    Vitals - 1 value per visit     Weight (lb) Height BMI  11/21/2021 280  5\' 9"  (1.753 m)  41.35 kg/m2   12/04/2021 284  5\' 9"  (1.753 m)  41.94 kg/m2   01/22/2022 260.2  5\' 9"  (1.753 m)  38.42 kg/m2

## 2022-01-25 DIAGNOSIS — Z720 Tobacco use: Secondary | ICD-10-CM | POA: Diagnosis not present

## 2022-02-07 NOTE — Progress Notes (Signed)
Pt here for injection education

## 2022-03-17 ENCOUNTER — Ambulatory Visit: Payer: BC Managed Care – PPO | Admitting: Family Medicine

## 2022-03-19 ENCOUNTER — Encounter: Payer: Self-pay | Admitting: Family Medicine

## 2022-03-19 ENCOUNTER — Ambulatory Visit (INDEPENDENT_AMBULATORY_CARE_PROVIDER_SITE_OTHER): Payer: BC Managed Care – PPO | Admitting: Family Medicine

## 2022-03-19 VITALS — BP 135/84 | HR 84 | Temp 98.2°F | Ht 69.0 in | Wt 249.0 lb

## 2022-03-19 DIAGNOSIS — Z6836 Body mass index (BMI) 36.0-36.9, adult: Secondary | ICD-10-CM

## 2022-03-19 DIAGNOSIS — E1169 Type 2 diabetes mellitus with other specified complication: Secondary | ICD-10-CM | POA: Diagnosis not present

## 2022-03-19 DIAGNOSIS — Z6841 Body Mass Index (BMI) 40.0 and over, adult: Secondary | ICD-10-CM

## 2022-03-19 DIAGNOSIS — Z6838 Body mass index (BMI) 38.0-38.9, adult: Secondary | ICD-10-CM

## 2022-03-19 DIAGNOSIS — F17210 Nicotine dependence, cigarettes, uncomplicated: Secondary | ICD-10-CM

## 2022-03-19 DIAGNOSIS — Z23 Encounter for immunization: Secondary | ICD-10-CM | POA: Diagnosis not present

## 2022-03-19 DIAGNOSIS — E119 Type 2 diabetes mellitus without complications: Secondary | ICD-10-CM

## 2022-03-19 DIAGNOSIS — K319 Disease of stomach and duodenum, unspecified: Secondary | ICD-10-CM | POA: Diagnosis not present

## 2022-03-19 DIAGNOSIS — E662 Morbid (severe) obesity with alveolar hypoventilation: Secondary | ICD-10-CM | POA: Diagnosis not present

## 2022-03-19 DIAGNOSIS — K580 Irritable bowel syndrome with diarrhea: Secondary | ICD-10-CM

## 2022-03-19 DIAGNOSIS — K589 Irritable bowel syndrome without diarrhea: Secondary | ICD-10-CM

## 2022-03-19 DIAGNOSIS — K219 Gastro-esophageal reflux disease without esophagitis: Secondary | ICD-10-CM

## 2022-03-19 DIAGNOSIS — G4733 Obstructive sleep apnea (adult) (pediatric): Secondary | ICD-10-CM

## 2022-03-19 LAB — POCT GLYCOSYLATED HEMOGLOBIN (HGB A1C)
HbA1c POC (<> result, manual entry): 5.1 % (ref 4.0–5.6)
HbA1c, POC (controlled diabetic range): 5.1 % (ref 0.0–7.0)
HbA1c, POC (prediabetic range): 5.1 % — AB (ref 5.7–6.4)
Hemoglobin A1C: 5.1 % (ref 4.0–5.6)

## 2022-03-19 MED ORDER — AMITRIPTYLINE HCL 25 MG PO TABS: ORAL_TABLET | ORAL | 1 refills | Status: DC

## 2022-03-19 MED ORDER — NALTREXONE HCL 50 MG PO TABS: 25.0000 mg | ORAL_TABLET | Freq: Two times a day (BID) | ORAL | 5 refills | Status: DC

## 2022-03-19 MED ORDER — PANTOPRAZOLE SODIUM 40 MG PO TBEC: 40.0000 mg | DELAYED_RELEASE_TABLET | Freq: Every day | ORAL | 1 refills | Status: DC

## 2022-03-19 MED ORDER — BUPROPION HCL ER (XL) 150 MG PO TB24: 150.0000 mg | ORAL_TABLET | Freq: Every day | ORAL | 1 refills | Status: DC

## 2022-03-19 NOTE — Patient Instructions (Addendum)
Return in about 3 months (around 06/20/2022) for Routine chronic condition follow-up.        Great to see you today.  I have refilled the medication(s) we provide.   If labs were collected, we will inform you of lab results once received either by echart message or telephone call.   - echart message- for normal results that have been seen by the patient already.   - telephone call: abnormal results or if patient has not viewed results in their echart.

## 2022-03-19 NOTE — Progress Notes (Unsigned)
Caleb Norris , Nov 28, 1980, 41 y.o., male MRN: [id number] Patient Care Team    Relationship Specialty Notifications Start End  Ma Hillock, DO PCP - General Family Medicine  02/04/19   Deneise Lever, MD Consulting Physician Pulmonary Disease  01/04/19   Melida Quitter, MD Consulting Physician Otolaryngology  02/04/19     Chief Complaint  Patient presents with   Diabetes    [facility]; pt is fasting  ***   Subjective: Pt presents for an OV to obtain assistance to stop smoking and weight loss.  Smoking cessation: Patient reports he has smoked for about 11 years total of his life.  He smoked for 10 years about 1-1/2 packs a day of marble lights.  He then quit smoking for about 10 years, recently restarted about a year ago.  He states for the last year he smoked about a pack a day of marble lights.  He states he restarted smoking when he was going through a stressful time in his life.  He has never used medications, patches or other nicotine replacements in the past. He is tolerating the Wellbutrin and Depade.  He states he has not quit smoking yet.  He is cut back by half.  He is looking into a program through his insurance that will pay for his nicotine patches.  Diabetes-obesity: Last eA1c 6.3.   he has serious comorbidities of OSA, GERD. She reports today he has been doing well.  He has been able to lose weight and feels good about his weight loss so far.  He is tolerating the [facility], now up to 5 mg weekly injection without any side effects.  He is making alterations to his diet cutting back on the sugar content and the complex carbohydrate content.  He is yet to start exercising. Daily life is rather sedentary.  He enjoys golfing on the weekends.     Vitals - 1 value per visit     Weight (lb) Height BMI  11/21/2021 280  5\' 9"  (1.753 m)  41.35 kg/m2   12/04/2021 284  5\' 9"  (1.753 m)  41.94 kg/m2   01/22/2022 260.2  5\' 9"  (1.753 m)  38.42 kg/m2   03/19/2022 249  5\' 9"  (1.753 m)  36.77  kg/m2         04/23/2021    8:04 AM 05/07/2020    9:32 AM 02/04/2019    8:08 AM 03/16/2017    2:51 PM  Depression screen PHQ 2/9  Decreased Interest 0 0 0 0  Down, Depressed, Hopeless 0  0 0  PHQ - 2 Score 0 0 0 0    Allergies  Allergen Reactions   Compazine [Prochlorperazine Edisylate] Anaphylaxis   Social History   Social History Narrative   Married, 1 son and 1 daughter.   Educ: [employer] degree.   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   Tob: former, 15 pack-yr hx.  Quit 2011.   Alcohol: occasional.   Drugs: none.   Past Medical History:  Diagnosis Date   GERD (gastroesophageal reflux disease)    Headache    occassionally   History of kidney stones    IBS (irritable bowel syndrome)    Metabolic syndrome 99991111   Trigs, HDL, waist circ   Obesity, Class I, BMI 30-34.9    OSA (obstructive sleep apnea) 12/2018   CPAP recommended 12/2018   Wears contact lenses    Wears glasses    Past Surgical History:  Procedure Laterality Date  CYSTOSCOPY/URETEROSCOPY/HOLMIUM LASER/STENT PLACEMENT Right 03/31/2019   Procedure: CYSTOSCOPY RIGHT RETROGRAE PYELOGRAM /URETEROSCOPY HOLMIUM LASER/STENT PLACEMENT;  Surgeon: Bjorn Pippin, MD;  Location: [facility];  Service: Urology;  Laterality: Right;   LEG SURGERY Right    MULTIPLE TOOTH EXTRACTIONS     THYROGLOSSAL DUCT CYST Left 03/16/2015   Procedure: Excision Left neck cyst;  Surgeon: Christia Reading, MD;  Location: [facility] OR;  Service: ENT;  Laterality: Left;   Family History  Problem Relation Age of Onset   Hyperlipidemia Father    Hypertension Father    Heart disease Maternal Grandfather    Hyperlipidemia Maternal Grandfather    Hypertension Maternal Grandfather    Heart attack Maternal Grandfather    Stroke Neg Hx    Diabetes Neg Hx    Cancer Neg Hx    Allergies as of 03/19/2022       Reactions   Compazine [prochlorperazine Edisylate] Anaphylaxis        Medication List        Accurate as of  March 19, 2022  8:24 AM. If you have any questions, ask your nurse or doctor.          amitriptyline 25 MG tablet Commonly known as: ELAVIL TAKE 1 TO 2 TABLETS BY MOUTH EVERY DAY FOR IRRITABLE BOWEL SYNDROME   b complex vitamins tablet Take 1 tablet by mouth daily.   buPROPion 150 MG 24 hr tablet Commonly known as: Wellbutrin XL Take 1 tablet (150 mg total) by mouth daily.   cetirizine 10 MG tablet Commonly known as: ZYRTEC Take 10 mg by mouth daily.   multivitamin tablet Take 1 tablet by mouth daily.   naltrexone 50 MG tablet Commonly known as: DEPADE Take 0.5 tablets (25 mg total) by mouth 2 (two) times daily. With meal   pantoprazole 40 MG tablet Commonly known as: PROTONIX Take 1 tablet (40 mg total) by mouth daily.   tirzepatide 7.5 MG/0.5ML Pen Commonly known as: MOUNJARO Inject 7.5 mg into the skin once a week.   vitamin B-12 500 MCG tablet Commonly known as: CYANOCOBALAMIN Take by mouth.        All past medical history, surgical history, allergies, family history, immunizations andmedications were updated in the EMR today and reviewed under the history and medication portions of their EMR.     ROS Negative, with the exception of above mentioned in HPI   Objective:  BP 135/84   Pulse 84   Temp 98.2 F (36.8 C) (Oral)   Ht 5\' 9"  (1.753 m)   Wt 249 lb (112.9 kg)   SpO2 97%   BMI 36.77 kg/m  Body mass index is 36.77 kg/m. Physical Exam Vitals and nursing note reviewed.  Constitutional:      General: He is not in acute distress.    Appearance: Normal appearance. He is not ill-appearing, toxic-appearing or diaphoretic.  HENT:     Head: Normocephalic and atraumatic.  Eyes:     General: No scleral icterus.       Right eye: No discharge.        Left eye: No discharge.     Extraocular Movements: Extraocular movements intact.     Pupils: Pupils are equal, round, and reactive to light.  Cardiovascular:     Rate and Rhythm: Normal rate and  regular rhythm.  Pulmonary:     Effort: Pulmonary effort is normal.     Breath sounds: Normal breath sounds.  Musculoskeletal:     Right lower leg: No edema.  Left lower leg: No edema.  Skin:    General: Skin is warm and dry.     Coloration: Skin is not jaundiced or pale.     Findings: No rash.  Neurological:     Mental Status: He is alert and oriented to person, place, and time. Mental status is at baseline.  Psychiatric:        Mood and Affect: Mood normal.        Behavior: Behavior normal.        Thought Content: Thought content normal.        Judgment: Judgment normal.    Diabetic Foot Exam - Simple   No data filed      No results found. No results found. No results found for this or any previous visit (from the past 24 hour(s)).  Assessment/Plan: BRAEDY SHAWLEY is a 41 y.o. male present for OV for  Obesity with alveolar hypoventilation and body mass index (BMI) of 38 o/Weight loss counseling, encounter for/Morbid obesity with BMI of 40.0-44.9, adult [facility]) Patient has been counseled on exercise, calorie counting, weight loss and potential medications to help with weight loss today. -Patient has been provided with online resources for: Weekly net calorie calculator.  Applications for calorie counting.  Patient was advised to ensure she is taking in adequate nutrition daily by meeting calorie goals. -Patient has been educated on dietary changes to not only lose weight but to eat healthy.   - Patient has been educated on glycemic index and foods to omit from his diet. -Patient has been educated on exercise goal of 150 minutes a week (plus warm up and cool down) of cardiovascular exercise.  He needs to work on exercise regimen as his goal before next appointment. -Patient has been encouraged to maintain adequate water consumption of at least 120 ounces a day, more if exercising/sweating. -Patient was encouraged to meal prep.  Consider online meal subscriptions that are  low in carbohydrate. - Continue Wellbutrin 150 mg twice daily - Continue Depade half a tablet twice daily before meals  Type 2 diabetes mellitus with other specified complication, without long-term current use of insulin ([place]) With his history of IBS metformin is not ideal. He is interested in weekly injections to treat his elevated A1c.  He may receive benefit in weight loss with medication as well.  This would be helpful for him to decrease his serious comorbid conditions of GERD and OSA. Discussed diabetic diet and exercise - Hemoglobin A1c>6.3 -Continue Mounjaro 5 mg until current prescription is completed, then increase to Mounjaro 7.5 mg daily  OSA (obstructive sleep apnea) Weight loss encouraged to help improve condition.  Gastroesophageal reflux disease without esophagitis Weight loss encouraged to help improve symptoms. Continue Protonix 40 mg daily  Cigarette nicotine dependence without complication/Encounter for smoking cessation counseling Patient was encouraged to take the next step and pick a quit date. He hopefully will be getting his nicotine patches through his insurance company program Continue Wellbutrin  Reviewed expectations re: course of current medical issues. Discussed self-management of symptoms. Outlined signs and symptoms indicating need for more acute intervention. Patient verbalized understanding and all questions were answered. Patient received an After-Visit Summary.    Orders Placed This Encounter  Procedures   Flu Vaccine QUAD 6+ mos PF IM (Fluarix Quad PF)   POCT HgB A1C   No orders of the defined types were placed in this encounter.  Referral Orders  No referral(s) requested today     Note is dictated  utilizing voice recognition software. Although note has been proof read prior to signing, occasional typographical errors still can be missed. If any questions arise, please do not hesitate to call for verification.   electronically signed  by:  Howard Pouch, DO  [place]

## 2022-04-24 ENCOUNTER — Ambulatory Visit (INDEPENDENT_AMBULATORY_CARE_PROVIDER_SITE_OTHER): Payer: BC Managed Care – PPO | Admitting: Family Medicine

## 2022-04-24 ENCOUNTER — Encounter: Payer: Self-pay | Admitting: Family Medicine

## 2022-04-24 VITALS — BP 130/84 | HR 101 | Temp 98.2°F | Ht 70.67 in | Wt 246.4 lb

## 2022-04-24 DIAGNOSIS — G4733 Obstructive sleep apnea (adult) (pediatric): Secondary | ICD-10-CM | POA: Diagnosis not present

## 2022-04-24 DIAGNOSIS — E662 Morbid (severe) obesity with alveolar hypoventilation: Secondary | ICD-10-CM | POA: Diagnosis not present

## 2022-04-24 DIAGNOSIS — E1169 Type 2 diabetes mellitus with other specified complication: Secondary | ICD-10-CM

## 2022-04-24 DIAGNOSIS — Z0000 Encounter for general adult medical examination without abnormal findings: Secondary | ICD-10-CM

## 2022-04-24 DIAGNOSIS — K219 Gastro-esophageal reflux disease without esophagitis: Secondary | ICD-10-CM | POA: Diagnosis not present

## 2022-04-24 DIAGNOSIS — K580 Irritable bowel syndrome with diarrhea: Secondary | ICD-10-CM | POA: Diagnosis not present

## 2022-04-24 DIAGNOSIS — Z6841 Body Mass Index (BMI) 40.0 and over, adult: Secondary | ICD-10-CM

## 2022-04-24 LAB — COMPREHENSIVE METABOLIC PANEL
ALT: 17 U/L (ref 0–53)
AST: 13 U/L (ref 0–37)
Albumin: 4.6 g/dL (ref 3.5–5.2)
Alkaline Phosphatase: 73 U/L (ref 39–117)
BUN: 10 mg/dL (ref 6–23)
CO2: 27 mEq/L (ref 19–32)
Calcium: 9.6 mg/dL (ref 8.4–10.5)
Chloride: 100 mEq/L (ref 96–112)
Creatinine, Ser: 0.75 mg/dL (ref 0.40–1.50)
GFR: 112.09 mL/min (ref >60.00)
Glucose, Bld: 103 mg/dL — ABNORMAL HIGH (ref 70–99)
Potassium: 3.8 mEq/L (ref 3.5–5.1)
Sodium: 139 mEq/L (ref 135–145)
Total Bilirubin: 0.4 mg/dL (ref 0.2–1.2)
Total Protein: 7.7 g/dL (ref 6.0–8.3)

## 2022-04-24 LAB — CBC WITH DIFFERENTIAL/PLATELET
Basophils Absolute: 0 10*3/uL (ref 0.0–0.1)
Basophils Relative: 0.2 % (ref 0.0–3.0)
Eosinophils Absolute: 0.1 10*3/uL (ref 0.0–0.7)
Eosinophils Relative: 0.8 % (ref 0.0–5.0)
HCT: 46 % (ref 39.0–52.0)
Hemoglobin: 14.8 g/dL (ref 13.0–17.0)
Lymphocytes Relative: 20 % (ref 12.0–46.0)
Lymphs Abs: 2.6 10*3/uL (ref 0.7–4.0)
MCHC: 32.1 g/dL (ref 30.0–36.0)
MCV: 84.5 fl (ref 78.0–100.0)
Monocytes Absolute: 0.4 10*3/uL (ref 0.1–1.0)
Monocytes Relative: 3.4 % (ref 3.0–12.0)
Neutro Abs: 9.9 10*3/uL — ABNORMAL HIGH (ref 1.4–7.7)
Neutrophils Relative %: 75.6 % (ref 43.0–77.0)
Platelets: 246 10*3/uL (ref 150.0–400.0)
RBC: 5.44 Mil/uL (ref 4.22–5.81)
RDW: 14.8 % (ref 11.5–15.5)
WBC: 13.2 10*3/uL — ABNORMAL HIGH (ref 4.0–10.5)

## 2022-04-24 LAB — VITAMIN D 25 HYDROXY (VIT D DEFICIENCY, FRACTURES): VITD: 32.31 ng/mL (ref 30.00–100.00)

## 2022-04-24 LAB — TSH: TSH: 1.52 u[IU]/mL (ref 0.35–5.50)

## 2022-04-24 LAB — LIPID PANEL
Cholesterol: 187 mg/dL (ref 0–200)
HDL: 44.9 mg/dL (ref >39.00)
NonHDL: 141.87
Total CHOL/HDL Ratio: 4
Triglycerides: 316 mg/dL — ABNORMAL HIGH (ref 0.0–149.0)
VLDL: 63.2 mg/dL — ABNORMAL HIGH (ref 0.0–40.0)

## 2022-04-24 LAB — LDL CHOLESTEROL, DIRECT: Direct LDL: 108 mg/dL

## 2022-04-24 MED ORDER — TIRZEPATIDE 12.5 MG/0.5ML ~~LOC~~ SOAJ: 12.5000 mg | SUBCUTANEOUS | 1 refills | Status: DC

## 2022-04-24 NOTE — Patient Instructions (Addendum)
Return for Routine chronic condition follow-up.        Great to see you today.  I have refilled the medication(s) we provide.   If labs were collected, we will inform you of lab results once received either by echart message or telephone call.   - echart message- for normal results that have been seen by the patient already.   - telephone call: abnormal results or if patient has not viewed results in their echart.   Health Maintenance, Male Adopting a healthy lifestyle and getting preventive care are important in promoting health and wellness. Ask your health care provider about: The right schedule for you to have regular tests and exams. Things you can do on your own to prevent diseases and keep yourself healthy. What should I know about diet, weight, and exercise? Eat a healthy diet  Eat a diet that includes plenty of vegetables, fruits, low-fat dairy products, and lean protein. Do not eat a lot of foods that are high in solid fats, added sugars, or sodium. Maintain a healthy weight Body mass index (BMI) is a measurement that can be used to identify possible weight problems. It estimates body fat based on height and weight. Your health care provider can help determine your BMI and help you achieve or maintain a healthy weight. Get regular exercise Get regular exercise. This is one of the most important things you can do for your health. Most adults should: Exercise for at least 150 minutes each week. The exercise should increase your heart rate and make you sweat (moderate-intensity exercise). Do strengthening exercises at least twice a week. This is in addition to the moderate-intensity exercise. Spend less time sitting. Even light physical activity can be beneficial. Watch cholesterol and blood lipids Have your blood tested for lipids and cholesterol at 41 years of age, then have this test every 5 years. You may need to have your cholesterol levels checked more often if: Your  lipid or cholesterol levels are high. You are older than 41 years of age. You are at high risk for heart disease. What should I know about cancer screening? Many types of cancers can be detected early and may often be prevented. Depending on your health history and family history, you may need to have cancer screening at various ages. This may include screening for: Colorectal cancer. Prostate cancer. Skin cancer. Lung cancer. What should I know about heart disease, diabetes, and high blood pressure? Blood pressure and heart disease High blood pressure causes heart disease and increases the risk of stroke. This is more likely to develop in people who have high blood pressure readings or are overweight. Talk with your health care provider about your target blood pressure readings. Have your blood pressure checked: Every 3-5 years if you are 63-69 years of age. Every year if you are 58 years old or older. If you are between the ages of 79 and 57 and are a current or former smoker, ask your health care provider if you should have a one-time screening for abdominal aortic aneurysm (AAA). Diabetes Have regular diabetes screenings. This checks your fasting blood sugar level. Have the screening done: Once every three years after age 28 if you are at a normal weight and have a low risk for diabetes. More often and at a younger age if you are overweight or have a high risk for diabetes. What should I know about preventing infection? Hepatitis B If you have a higher risk for hepatitis B, you should  be screened for this virus. Talk with your health care provider to find out if you are at risk for hepatitis B infection. Hepatitis C Blood testing is recommended for: Everyone born from [birth year] through 1965. Anyone with known risk factors for hepatitis C. Sexually transmitted infections (STIs) You should be screened each year for STIs, including gonorrhea and chlamydia, if: You are sexually active and  are younger than 41 years of age. You are older than 41 years of age and your health care provider tells you that you are at risk for this type of infection. Your sexual activity has changed since you were last screened, and you are at increased risk for chlamydia or gonorrhea. Ask your health care provider if you are at risk. Ask your health care provider about whether you are at high risk for HIV. Your health care provider may recommend a prescription medicine to help prevent HIV infection. If you choose to take medicine to prevent HIV, you should first get tested for HIV. You should then be tested every 3 months for as long as you are taking the medicine. Follow these instructions at home: Alcohol use Do not drink alcohol if your health care provider tells you not to drink. If you drink alcohol: Limit how much you have to 0-2 drinks a day. Know how much alcohol is in your drink. In the [place] one drink equals one 12 oz bottle of beer (355 mL), one 5 oz glass of wine (148 mL), or one 1 oz glass of hard liquor (44 mL). Lifestyle Do not use any products that contain nicotine or tobacco. These products include cigarettes, chewing tobacco, and vaping devices, such as e-cigarettes. If you need help quitting, ask your health care provider. Do not use street drugs. Do not share needles. Ask your health care provider for help if you need support or information about quitting drugs. General instructions Schedule regular health, dental, and eye exams. Stay current with your vaccines. Tell your health care provider if: You often feel depressed. You have ever been abused or do not feel safe at home. Summary Adopting a healthy lifestyle and getting preventive care are important in promoting health and wellness. Follow your health care provider's instructions about healthy diet, exercising, and getting tested or screened for diseases. Follow your health care provider's instructions on monitoring your  cholesterol and blood pressure. This information is not intended to replace advice given to you by your health care provider. Make sure you discuss any questions you have with your health care provider. Document Revised: 10/29/2020 Document Reviewed: 10/29/2020 Elsevier Patient Education  [place].

## 2022-04-24 NOTE — Progress Notes (Signed)
Caleb Norris , 01/09/1981, 41 y.o., male MRN: [id number] Patient Care Team    Relationship Specialty Notifications Start End  Natalia Leatherwood, DO PCP - General Family Medicine  02/04/19   Waymon Budge, MD Consulting Physician Pulmonary Disease  01/04/19   Christia Reading, MD Consulting Physician Otolaryngology  02/04/19     Chief Complaint  Patient presents with   Annual Exam    [facility]mc; pt is fasting     Subjective: Caleb Norris is a 41 y.o. male present for cpe  Health maintenance:  Colonoscopy: No family history, routine screening at 40 recommended Immunizations: Tdap completed 06/2014, influenza UTD 2023 Infectious disease screening: HIV and hepatitis C - completed PSA: No family history routine screening at 50 recommended   Diabetes-obesity: He has serious comorbidities of OSA, GERD.  Doing well.  He is still losing weight -another 4 lbs-and watching his diet.  He is tolerating Mounjaro 7.5 mg weekly injection without any side effects.  He still is not exercising routinely.  He is making alterations to his diet cutting back on the sugar content and the complex carbohydrate content.   Daily life is rather sedentary.  He enjoys golfing on the weekends.        04/24/2022    8:13 AM 04/23/2021    8:04 AM 05/07/2020    9:32 AM 02/04/2019    8:08 AM 03/16/2017    2:51 PM  Depression screen PHQ 2/9  Decreased Interest 0 0 0 0 0  Down, Depressed, Hopeless 0 0  0 0  PHQ - 2 Score 0 0 0 0 0  Altered sleeping 1      Tired, decreased energy 1      Change in appetite 1      Feeling bad or failure about yourself  0      Trouble concentrating 0      Moving slowly or fidgety/restless 0      Suicidal thoughts 0      PHQ-9 Score 3        Allergies  Allergen Reactions   Compazine [Prochlorperazine Edisylate] Anaphylaxis   Social History   Social History Narrative   Married, 1 son and 1 daughter.   Educ: [employer] degree.   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   Tob: former, 15 pack-yr hx.  Quit 2011.   Alcohol: occasional.   Drugs: none.   Past Medical History:  Diagnosis Date   GERD (gastroesophageal reflux disease)    Headache    occassionally   History of kidney stones    IBS (irritable bowel syndrome)    Metabolic syndrome 2018   Trigs, HDL, waist circ   Obesity, Class I, BMI 30-34.9    OSA (obstructive sleep apnea) 12/2018   CPAP recommended 12/2018   Wears contact lenses    Wears glasses    Past Surgical History:  Procedure Laterality Date   CYSTOSCOPY/URETEROSCOPY/HOLMIUM LASER/STENT PLACEMENT Right 03/31/2019   Procedure: CYSTOSCOPY RIGHT RETROGRAE PYELOGRAM /URETEROSCOPY HOLMIUM LASER/STENT PLACEMENT;  Surgeon: Bjorn Pippin, MD;  Location: [facility];  Service: Urology;  Laterality: Right;   LEG SURGERY Right    MULTIPLE TOOTH EXTRACTIONS     THYROGLOSSAL DUCT CYST Left 03/16/2015   Procedure: Excision Left neck cyst;  Surgeon: Christia Reading, MD;  Location: [facility] OR;  Service: ENT;  Laterality: Left;   Family History  Problem Relation Age of Onset   Hyperlipidemia Father    Hypertension Father  Heart disease Maternal Grandfather    Hyperlipidemia Maternal Grandfather    Hypertension Maternal Grandfather    Heart attack Maternal Grandfather    Stroke Neg Hx    Diabetes Neg Hx    Cancer Neg Hx    Allergies as of 04/24/2022       Reactions   Compazine [prochlorperazine Edisylate] Anaphylaxis        Medication List        Accurate as of April 24, 2022  8:30 AM. If you have any questions, ask your nurse or doctor.          amitriptyline 25 MG tablet Commonly known as: ELAVIL TAKE 1 TO 2 TABLETS BY MOUTH EVERY DAY FOR IRRITABLE BOWEL SYNDROME   b complex vitamins tablet Take 1 tablet by mouth daily.   buPROPion 150 MG 24 hr tablet Commonly known as: Wellbutrin XL Take 1 tablet (150 mg total) by mouth daily.   cetirizine 10 MG tablet Commonly known as: ZYRTEC Take 10 mg by  mouth daily.   multivitamin tablet Take 1 tablet by mouth daily.   naltrexone 50 MG tablet Commonly known as: DEPADE Take 0.5 tablets (25 mg total) by mouth 2 (two) times daily. With meal   pantoprazole 40 MG tablet Commonly known as: PROTONIX Take 1 tablet (40 mg total) by mouth daily.   tirzepatide 7.5 MG/0.5ML Pen Commonly known as: MOUNJARO Inject 7.5 mg into the skin once a week. What changed: Another medication with the same name was added. Make sure you understand how and when to take each. Changed by: Howard Pouch, DO   tirzepatide 12.5 MG/0.5ML Pen Commonly known as: MOUNJARO Inject 12.5 mg into the skin once a week. What changed: You were already taking a medication with the same name, and this prescription was added. Make sure you understand how and when to take each. Changed by: Howard Pouch, DO   vitamin B-12 500 MCG tablet Commonly known as: CYANOCOBALAMIN Take by mouth.        All past medical history, surgical history, allergies, family history, immunizations andmedications were updated in the EMR today and reviewed under the history and medication portions of their EMR.     ROS Negative, with the exception of above mentioned in HPI   Objective:  BP 130/84   Pulse (!) 101   Temp 98.2 F (36.8 C)   Ht 5' 10.67" (1.795 m)   Wt 246 lb 6.4 oz (111.8 kg)   SpO2 97%   BMI 34.69 kg/m  Body mass index is 34.69 kg/m. Physical Exam Vitals and nursing note reviewed.  Constitutional:      General: He is not in acute distress.    Appearance: Normal appearance. He is obese. He is not ill-appearing, toxic-appearing or diaphoretic.  HENT:     Head: Normocephalic and atraumatic.     Right Ear: Tympanic membrane, ear canal and external ear normal. There is no impacted cerumen.     Left Ear: Tympanic membrane, ear canal and external ear normal. There is no impacted cerumen.     Nose: Nose normal. No congestion or rhinorrhea.     Mouth/Throat:     Mouth:  Mucous membranes are moist.     Pharynx: Oropharynx is clear. No oropharyngeal exudate or posterior oropharyngeal erythema.  Eyes:     General: No scleral icterus.       Right eye: No discharge.        Left eye: No discharge.     Extraocular  Movements: Extraocular movements intact.     Pupils: Pupils are equal, round, and reactive to light.  Cardiovascular:     Rate and Rhythm: Normal rate and regular rhythm.     Pulses: Normal pulses.     Heart sounds: Normal heart sounds. No murmur heard.    No friction rub. No gallop.  Pulmonary:     Effort: Pulmonary effort is normal. No respiratory distress.     Breath sounds: Normal breath sounds. No stridor. No wheezing, rhonchi or rales.  Chest:     Chest wall: No tenderness.  Abdominal:     General: Abdomen is flat. Bowel sounds are normal. There is no distension.     Palpations: Abdomen is soft. There is no mass.     Tenderness: There is no abdominal tenderness. There is no right CVA tenderness, left CVA tenderness, guarding or rebound.     Hernia: No hernia is present.  Musculoskeletal:        General: No swelling or tenderness. Normal range of motion.     Cervical back: Normal range of motion and neck supple.     Right lower leg: No edema.     Left lower leg: No edema.  Lymphadenopathy:     Cervical: No cervical adenopathy.  Skin:    General: Skin is warm and dry.     Coloration: Skin is not jaundiced or pale.     Findings: No bruising, lesion or rash.  Neurological:     General: No focal deficit present.     Mental Status: He is alert and oriented to person, place, and time. Mental status is at baseline.     Cranial Nerves: No cranial nerve deficit.     Sensory: No sensory deficit.     Motor: No weakness.     Coordination: Coordination normal.     Gait: Gait normal.     Deep Tendon Reflexes: Reflexes normal.  Psychiatric:        Mood and Affect: Mood normal.        Behavior: Behavior normal.        Thought Content: Thought  content normal.        Judgment: Judgment normal.     No results found. No results found. No results found for this or any previous visit (from the past 24 hour(s)).  Assessment/Plan: Caleb Norris is a 41 y.o. male present for OV for  Type 2 diabetes mellitus with other specified complication, without long-term current use of insulin (HCC) With his history of IBS metformin is not ideal. increase Mounjaro 7.5 mg daily> 12.5 mg qd Discussed diabetic diet and exercise - Hemoglobin A1c>6.3> 5.1!!!  Lost 39 pounds Cbc, cmp, tsh collected today -Follow-up in 4 months  OSA (obstructive sleep apnea) Weight loss encouraged to help improve condition.  Gastroesophageal reflux disease without esophagitis Weight loss encouraged to help improve symptoms. Continue Protonix 40 mg daily Vit d collected today Obesity with alveolar hypoventilation and body mass index (BMI) of 40 or greater (HCC) - Lipid panel - Vitamin D (25 hydroxy) Morbid obesity with BMI of 40.0-44.9, adult (HCC) - Vitamin D (25 hydroxy) Routine general medical examination at a health care facility Patient was encouraged to exercise greater than 150 minutes a week. Patient was encouraged to choose a diet filled with fresh fruits and vegetables, and lean meats. AVS provided to patient today for education/recommendation on gender specific health and safety maintenance. Colonoscopy: No family history, routine screening at 74 recommended Immunizations: Tdap  completed 06/2014, influenza UTD 2023 Infectious disease screening: HIV and hepatitis C - completed PSA: No family history routine screening at 64 recommended  Reviewed expectations re: course of current medical issues. Discussed self-management of symptoms. Outlined signs and symptoms indicating need for more acute intervention. Patient verbalized understanding and all questions were answered. Patient received an After-Visit Summary.    Orders Placed This Encounter   Procedures   CBC with Differential/Platelet   Comprehensive metabolic panel   Lipid panel   TSH   Vitamin D (25 hydroxy)   Meds ordered this encounter  Medications   tirzepatide (MOUNJARO) 12.5 MG/0.5ML Pen    Sig: Inject 12.5 mg into the skin once a week.    Dispense:  6 mL    Refill:  1   Referral Orders  No referral(s) requested today     Note is dictated utilizing voice recognition software. Although note has been proof read prior to signing, occasional typographical errors still can be missed. If any questions arise, please do not hesitate to call for verification.   electronically signed by:  Felix Pacini, DO  [facility] [place]

## 2022-04-28 ENCOUNTER — Telehealth: Payer: Self-pay | Admitting: Family Medicine

## 2022-04-28 NOTE — Telephone Encounter (Signed)
Please call patient Liver, kidney and thyroid function are normal electrolytes are normal Cholesterol panel looks good overall, mildly elevated triglycerides.  Would encourage him to consider adding an omega-3 supplementation if not already. Vitamin D levels are normal at 32 His white blood cell counts were mildly elevated, this can be secondary to smoking or acute illness.  Not concerning at this time, but would recommend he stop smoking.  I know he has been working hard to quit.

## 2022-04-28 NOTE — Telephone Encounter (Signed)
Spoke with patient regarding results/recommendations.  

## 2022-06-25 ENCOUNTER — Ambulatory Visit (INDEPENDENT_AMBULATORY_CARE_PROVIDER_SITE_OTHER): Payer: BC Managed Care – PPO | Admitting: Family Medicine

## 2022-06-25 ENCOUNTER — Encounter: Payer: Self-pay | Admitting: Family Medicine

## 2022-06-25 VITALS — BP 147/89 | HR 98 | Temp 98.2°F | Ht 69.5 in | Wt 241.0 lb

## 2022-06-25 DIAGNOSIS — G4733 Obstructive sleep apnea (adult) (pediatric): Secondary | ICD-10-CM | POA: Diagnosis not present

## 2022-06-25 DIAGNOSIS — E1169 Type 2 diabetes mellitus with other specified complication: Secondary | ICD-10-CM

## 2022-06-25 DIAGNOSIS — F17210 Nicotine dependence, cigarettes, uncomplicated: Secondary | ICD-10-CM

## 2022-06-25 DIAGNOSIS — E1159 Type 2 diabetes mellitus with other circulatory complications: Secondary | ICD-10-CM

## 2022-06-25 DIAGNOSIS — I10 Essential (primary) hypertension: Secondary | ICD-10-CM

## 2022-06-25 DIAGNOSIS — Z6841 Body Mass Index (BMI) 40.0 and over, adult: Secondary | ICD-10-CM | POA: Diagnosis not present

## 2022-06-25 DIAGNOSIS — E662 Morbid (severe) obesity with alveolar hypoventilation: Secondary | ICD-10-CM | POA: Diagnosis not present

## 2022-06-25 LAB — POCT GLYCOSYLATED HEMOGLOBIN (HGB A1C)
HbA1c POC (<> result, manual entry): 5 % (ref 4.0–5.6)
HbA1c, POC (controlled diabetic range): 5 % (ref 0.0–7.0)
HbA1c, POC (prediabetic range): 5 % — AB (ref 5.7–6.4)
Hemoglobin A1C: 5 % (ref 4.0–5.6)

## 2022-06-25 MED ORDER — PANTOPRAZOLE SODIUM 40 MG PO TBEC: 40.0000 mg | DELAYED_RELEASE_TABLET | Freq: Every day | ORAL | 1 refills | Status: DC

## 2022-06-25 MED ORDER — AMITRIPTYLINE HCL 25 MG PO TABS: ORAL_TABLET | ORAL | 1 refills | Status: DC

## 2022-06-25 MED ORDER — LOSARTAN POTASSIUM 25 MG PO TABS: 25.0000 mg | ORAL_TABLET | Freq: Every day | ORAL | 1 refills | Status: DC

## 2022-06-25 MED ORDER — BUPROPION HCL ER (XL) 150 MG PO TB24: 150.0000 mg | ORAL_TABLET | Freq: Every day | ORAL | 1 refills | Status: DC

## 2022-06-25 NOTE — Patient Instructions (Addendum)
Added losartan for elevated blood pressure.  Follow up in 8 weeks for recheck

## 2022-06-25 NOTE — Progress Notes (Signed)
Caleb Norris , Dec 25, 1980, 42 y.o., male MRN: [id number] Patient Care Team    Relationship Specialty Notifications Start End  Ma Hillock, DO PCP - General Family Medicine  02/04/19   Deneise Lever, MD Consulting Physician Pulmonary Disease  01/04/19   Melida Quitter, MD Consulting Physician Otolaryngology  02/04/19     Chief Complaint  Patient presents with   Diabetes    Walker; pt is not fasting      Subjective: Pt presents for an OV to obtain assistance to stop smoking and weight loss. Hypertension: Pt has had elevated BP each visit. He is losing weight, exercising. He is still smoking- but cut back. Patient denies chest pain, shortness of breath or lower extremity edema.  Smoking cessation: Patient states he is not doing as well with smoking cessation.  He is cut back but has not quit as of yet he is still working on it. Prior note: Patient reports he has smoked for about 11 years total of his life.  He smoked for 10 years about 1-1/2 packs a day of marble lights.  He then quit smoking for about 10 years, recently restarted about a year ago.  He states for the last year he smoked about a pack a day of marble lights.  He states he restarted smoking when he was going through a stressful time in his life.  He has never used medications, patches or other nicotine replacements in the past. He is tolerating the Wellbutrin and Depade.  He states he has not quit smoking yet.  He is cut back by half.  He is looking into a program through his insurance that will pay for his nicotine patches.  Diabetes-obesity: He has serious comorbidities of OSA, GERD.  Doing well.  He is still losing weight and watching his diet.  He is tolerating Mounjaro 7.5 mg weekly injection without any side effects.  He still is not exercising routinely.  He is making alterations to his diet cutting back on the sugar content and the complex carbohydrate content.   Daily life is rather sedentary.  He enjoys  golfing on the weekends.        06/25/2022    9:46 AM 04/24/2022    8:13 AM 04/23/2021    8:04 AM 05/07/2020    9:32 AM 02/04/2019    8:08 AM  Depression screen PHQ 2/9  Decreased Interest 0 0 0 0 0  Down, Depressed, Hopeless 0 0 0  0  PHQ - 2 Score 0 0 0 0 0  Altered sleeping 1 1     Tired, decreased energy 0 1     Change in appetite 1 1     Feeling bad or failure about yourself  0 0     Trouble concentrating 0 0     Moving slowly or fidgety/restless 0 0     Suicidal thoughts 0 0     PHQ-9 Score 2 3       Allergies  Allergen Reactions   Compazine [Prochlorperazine Edisylate] Anaphylaxis   Social History   Social History Narrative   Married, 1 son and 1 daughter.   Educ: [employer] degree.   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   Tob: former, 15 pack-yr hx.  Quit 2011.   Alcohol: occasional.   Drugs: none.   Past Medical History:  Diagnosis Date   GERD (gastroesophageal reflux disease)    Headache    occassionally   History of  kidney stones    IBS (irritable bowel syndrome)    Metabolic syndrome 99991111   Trigs, HDL, waist circ   Obesity, Class I, BMI 30-34.9    OSA (obstructive sleep apnea) 12/2018   CPAP recommended 12/2018   Wears contact lenses    Wears glasses    Past Surgical History:  Procedure Laterality Date   CYSTOSCOPY/URETEROSCOPY/HOLMIUM LASER/STENT PLACEMENT Right 03/31/2019   Procedure: CYSTOSCOPY RIGHT RETROGRAE PYELOGRAM /URETEROSCOPY HOLMIUM LASER/STENT PLACEMENT;  Surgeon: Irine Seal, MD;  Location: [facility];  Service: Urology;  Laterality: Right;   LEG SURGERY Right    MULTIPLE TOOTH EXTRACTIONS     THYROGLOSSAL DUCT CYST Left 03/16/2015   Procedure: Excision Left neck cyst;  Surgeon: Melida Quitter, MD;  Location: [facility] OR;  Service: ENT;  Laterality: Left;   Family History  Problem Relation Age of Onset   Hyperlipidemia Father    Hypertension Father    Heart disease Maternal Grandfather    Hyperlipidemia Maternal  Grandfather    Hypertension Maternal Grandfather    Heart attack Maternal Grandfather    Stroke Neg Hx    Diabetes Neg Hx    Cancer Neg Hx    Allergies as of 06/25/2022       Reactions   Compazine [prochlorperazine Edisylate] Anaphylaxis        Medication List        Accurate as of June 25, 2022  9:56 AM. If you have any questions, ask your nurse or doctor.          amitriptyline 25 MG tablet Commonly known as: ELAVIL TAKE 1 TO 2 TABLETS BY MOUTH EVERY DAY FOR IRRITABLE BOWEL SYNDROME   b complex vitamins tablet Take 1 tablet by mouth daily.   buPROPion 150 MG 24 hr tablet Commonly known as: Wellbutrin XL Take 1 tablet (150 mg total) by mouth daily.   cetirizine 10 MG tablet Commonly known as: ZYRTEC Take 10 mg by mouth daily.   multivitamin tablet Take 1 tablet by mouth daily.   naltrexone 50 MG tablet Commonly known as: DEPADE Take 0.5 tablets (25 mg total) by mouth 2 (two) times daily. With meal   pantoprazole 40 MG tablet Commonly known as: PROTONIX Take 1 tablet (40 mg total) by mouth daily.   tirzepatide 7.5 MG/0.5ML Pen Commonly known as: MOUNJARO Inject 7.5 mg into the skin once a week.   tirzepatide 12.5 MG/0.5ML Pen Commonly known as: MOUNJARO Inject 12.5 mg into the skin once a week.   vitamin B-12 500 MCG tablet Commonly known as: CYANOCOBALAMIN Take by mouth.        All past medical history, surgical history, allergies, family history, immunizations andmedications were updated in the EMR today and reviewed under the history and medication portions of their EMR.     ROS Negative, with the exception of above mentioned in HPI   Objective:  BP (!) 147/89   Pulse 98   Temp 98.2 F (36.8 C) (Oral)   Ht 5' 9.5" (1.765 m)   Wt 241 lb (109.3 kg)   SpO2 97%   BMI 35.08 kg/m  Body mass index is 35.08 kg/m. Physical Exam Vitals and nursing note reviewed.  Constitutional:      General: He is not in acute distress.    Appearance:  Normal appearance. He is not ill-appearing, toxic-appearing or diaphoretic.  HENT:     Head: Normocephalic and atraumatic.  Eyes:     General: No scleral icterus.  Right eye: No discharge.        Left eye: No discharge.     Extraocular Movements: Extraocular movements intact.     Pupils: Pupils are equal, round, and reactive to light.  Cardiovascular:     Rate and Rhythm: Normal rate and regular rhythm.  Pulmonary:     Effort: Pulmonary effort is normal.     Breath sounds: Normal breath sounds.  Musculoskeletal:     Right lower leg: No edema.     Left lower leg: No edema.  Skin:    General: Skin is warm and dry.     Coloration: Skin is not jaundiced or pale.     Findings: No rash.  Neurological:     Mental Status: He is alert and oriented to person, place, and time. Mental status is at baseline.  Psychiatric:        Mood and Affect: Mood normal.        Behavior: Behavior normal.        Thought Content: Thought content normal.        Judgment: Judgment normal.     No results found. No results found. No results found for this or any previous visit (from the past 24 hour(s)).  Assessment/Plan: Caleb Norris is a 42 y.o. male present for OV for  Obesity with alveolar hypoventilation and body mass index (BMI) of 38 o/Weight loss counseling, encounter for/Morbid obesity with BMI of 40.0-44.9, adult [facility]) Patient has been counseled on exercise, calorie counting, weight loss and potential medications to help with weight loss today. -Patient has been provided with online resources for: Weekly net calorie calculator.  Applications for calorie counting.  Patient was advised to ensure she is taking in adequate nutrition daily by meeting calorie goals. -Patient has been educated on dietary changes to not only lose weight but to eat healthy.   - Patient has been educated on glycemic index and foods to omit from his diet. -Patient has been educated on exercise goal of 150 minutes  a week (plus warm up and cool down) of cardiovascular exercise.  He needs to work on exercise regimen as his goal before next appointment. -Patient has been encouraged to maintain adequate water consumption of at least 120 ounces a day, more if exercising/sweating. -He has been doing well with his diet. Continue Wellbutrin 150 mg twice daily  Type 2 diabetes mellitus with other specified complication, without long-term current use of insulin ([place]) With his history of IBS metformin is not ideal. Continue Mounjaro 12.5 mg daily.  Continue this dose for now. Discussed diabetic diet and exercise - Hemoglobin A1c>6.3> 5.1> 5.0!!!  Lost 40 pounds - FBG still mildly elevated -Follow-up in 8 weeks> will start tapering back at that visit when refills due.   OSA (obstructive sleep apnea) Weight loss encouraged to help improve condition.  Gastroesophageal reflux disease without esophagitis Weight loss encouraged to help improve symptoms. Continue  Protonix 40 mg daily  Cigarette nicotine dependence without complication/Encounter for smoking cessation counseling Continue Wellbutrin.  Pick a quit date  HTN: Has been above goal.  Start losartan 25 mg qd.  Low sodium diet F/u 8 weeks.    Reviewed expectations re: course of current medical issues. Discussed self-management of symptoms. Outlined signs and symptoms indicating need for more acute intervention. Patient verbalized understanding and all questions were answered. Patient received an After-Visit Summary.    Orders Placed This Encounter  Procedures   POCT HgB A1C   No orders of  the defined types were placed in this encounter.  Referral Orders  No referral(s) requested today     Note is dictated utilizing voice recognition software. Although note has been proof read prior to signing, occasional typographical errors still can be missed. If any questions arise, please do not hesitate to call for verification.   electronically  signed by:  Howard Pouch, DO  [place]

## 2022-07-10 ENCOUNTER — Telehealth: Payer: Self-pay | Admitting: Family Medicine

## 2022-07-10 NOTE — Telephone Encounter (Signed)
If there is no rash or swelling associated with the itching.  I would advised him to restart after 2 days to ensure it was actually the medication causing this reaction.  If he were to have a reaction to the medication, help with suspected it with occurred earlier.  Of course, it is not impossible therefore would like him to try it again and if the itching reoccurs, then call us back and we will call in a different medication.

## 2022-07-10 NOTE — Telephone Encounter (Signed)
Pt called and reports that over the past 2-3 days he has noticed an itch with taking Losartan. He did not take it today and says it was better. He says he going to stop taking  the medication and ask if there is an alternative he can take?

## 2022-07-10 NOTE — Telephone Encounter (Signed)
Spoke with patient regarding results/recommendations.  

## 2022-08-05 ENCOUNTER — Telehealth: Payer: Self-pay | Admitting: Family Medicine

## 2022-08-05 MED ORDER — TIRZEPATIDE 10 MG/0.5ML ~~LOC~~ SOAJ
10.0000 mg | SUBCUTANEOUS | 1 refills | Status: DC
  Filled 2022-09-03: qty 2, 28d supply, fill #0

## 2022-08-05 NOTE — Telephone Encounter (Signed)
I have called in a 10 mg Mounjaro to his pharmacy

## 2022-08-05 NOTE — Telephone Encounter (Signed)
LVM to inform pt meds was sent.

## 2022-08-05 NOTE — Telephone Encounter (Signed)
Pt is having trouble finding 12.4 mg Mounjaro in stock. Most pharmacies have it on backorder or do not have the mg needed. He reports that [facility] has 10 mg in stock. Please give the patient a call to discuss best options.

## 2022-08-09 ENCOUNTER — Other Ambulatory Visit: Payer: Self-pay | Admitting: Family Medicine

## 2022-08-22 DIAGNOSIS — R051 Acute cough: Secondary | ICD-10-CM | POA: Diagnosis not present

## 2022-08-22 DIAGNOSIS — Z03818 Encounter for observation for suspected exposure to other biological agents ruled out: Secondary | ICD-10-CM | POA: Diagnosis not present

## 2022-08-22 DIAGNOSIS — J1083 Influenza due to other identified influenza virus with otitis media: Secondary | ICD-10-CM | POA: Diagnosis not present

## 2022-08-29 ENCOUNTER — Ambulatory Visit: Payer: BC Managed Care – PPO | Admitting: Family Medicine

## 2022-09-01 ENCOUNTER — Ambulatory Visit (INDEPENDENT_AMBULATORY_CARE_PROVIDER_SITE_OTHER): Payer: BC Managed Care – PPO | Admitting: Family Medicine

## 2022-09-01 ENCOUNTER — Encounter: Payer: Self-pay | Admitting: Family Medicine

## 2022-09-01 VITALS — BP 149/90 | HR 94 | Temp 98.2°F | Wt 244.8 lb

## 2022-09-01 DIAGNOSIS — Z6841 Body Mass Index (BMI) 40.0 and over, adult: Secondary | ICD-10-CM

## 2022-09-01 DIAGNOSIS — F17210 Nicotine dependence, cigarettes, uncomplicated: Secondary | ICD-10-CM

## 2022-09-01 DIAGNOSIS — E1169 Type 2 diabetes mellitus with other specified complication: Secondary | ICD-10-CM

## 2022-09-01 DIAGNOSIS — G4733 Obstructive sleep apnea (adult) (pediatric): Secondary | ICD-10-CM

## 2022-09-01 DIAGNOSIS — I10 Essential (primary) hypertension: Secondary | ICD-10-CM | POA: Insufficient documentation

## 2022-09-01 DIAGNOSIS — K219 Gastro-esophageal reflux disease without esophagitis: Secondary | ICD-10-CM

## 2022-09-01 MED ORDER — TIRZEPATIDE 7.5 MG/0.5ML ~~LOC~~ SOAJ: 7.5000 mg | SUBCUTANEOUS | 0 refills | Status: DC

## 2022-09-01 MED ORDER — BUPROPION HCL ER (XL) 150 MG PO TB24: 150.0000 mg | ORAL_TABLET | Freq: Every day | ORAL | 1 refills | Status: DC

## 2022-09-01 MED ORDER — AMLODIPINE BESYLATE 5 MG PO TABS: 5.0000 mg | ORAL_TABLET | Freq: Every day | ORAL | 1 refills | Status: DC

## 2022-09-01 NOTE — Progress Notes (Signed)
Caleb Norris , December 23, 1980, 42 y.o., male MRN: [id number] Patient Care Team    Relationship Specialty Notifications Start End  Ma Hillock, DO PCP - General Family Medicine  02/04/19   Deneise Lever, MD Consulting Physician Pulmonary Disease  01/04/19   Melida Quitter, MD Consulting Physician Otolaryngology  02/04/19     Chief Complaint  Patient presents with   Hypertension    [facility]     Subjective: Pt presents for an OV to obtain assistance to stop smoking and weight loss. Hypertension: Patient reports compliance with losartan 25 mg daily that was started last visit secondary to elevated blood pressures.  He is losing weight, exercising. He is still smoking- but cut back.  Patient denies chest pain, shortness of breath, dizziness or lower extremity edema.    Smoking cessation: Patient states he is not doing as well with smoking cessation.  He is cut back but has not quit as of yet he is still working on it. Prior note: Patient reports he has smoked for about 11 years total of his life.  He smoked for 10 years about 1-1/2 packs a day of marble lights.  He then quit smoking for about 10 years, recently restarted about a year ago.  He states for the last year he smoked about a pack a day of marble lights.  He states he restarted smoking when he was going through a stressful time in his life.  He has never used medications, patches or other nicotine replacements in the past. He is tolerating the Wellbutrin and Depade.  He states he has not quit smoking yet.  He is cut back by half.  He is looking into a program through his insurance that will pay for his nicotine patches.  Diabetes-obesity: He has serious comorbidities of OSA, GERD.   He is compliant Mounjaro 10 mg weekly injection without any side effects.  He still is not exercising routinely.  He is making alterations to his diet cutting back on the sugar content and the complex carbohydrate content.   Daily life is rather  sedentary.  He enjoys golfing on the weekends.        09/01/2022    8:25 AM 06/25/2022    9:46 AM 04/24/2022    8:13 AM 04/23/2021    8:04 AM 05/07/2020    9:32 AM  Depression screen PHQ 2/9  Decreased Interest 0 0 0 0 0  Down, Depressed, Hopeless 0 0 0 0   PHQ - 2 Score 0 0 0 0 0  Altered sleeping  1 1    Tired, decreased energy  0 1    Change in appetite  1 1    Feeling bad or failure about yourself   0 0    Trouble concentrating  0 0    Moving slowly or fidgety/restless  0 0    Suicidal thoughts  0 0    PHQ-9 Score  2 3      Allergies  Allergen Reactions   Compazine [Prochlorperazine Edisylate] Anaphylaxis   Losartan     itch   Social History   Social History Narrative   Married, 1 son and 1 daughter.   Educ: [employer] degree.   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   Tob: former, 15 pack-yr hx.  Quit 2011.   Alcohol: occasional.   Drugs: none.   Past Medical History:  Diagnosis Date   GERD (gastroesophageal reflux disease)    Headache  occassionally   History of kidney stones    IBS (irritable bowel syndrome)    Metabolic syndrome 99991111   Trigs, HDL, waist circ   Obesity, Class I, BMI 30-34.9    OSA (obstructive sleep apnea) 12/2018   CPAP recommended 12/2018   Wears contact lenses    Wears glasses    Past Surgical History:  Procedure Laterality Date   CYSTOSCOPY/URETEROSCOPY/HOLMIUM LASER/STENT PLACEMENT Right 03/31/2019   Procedure: CYSTOSCOPY RIGHT RETROGRAE PYELOGRAM /URETEROSCOPY HOLMIUM LASER/STENT PLACEMENT;  Surgeon: Irine Seal, MD;  Location: [facility];  Service: Urology;  Laterality: Right;   LEG SURGERY Right    MULTIPLE TOOTH EXTRACTIONS     THYROGLOSSAL DUCT CYST Left 03/16/2015   Procedure: Excision Left neck cyst;  Surgeon: Melida Quitter, MD;  Location: [facility] OR;  Service: ENT;  Laterality: Left;   Family History  Problem Relation Age of Onset   Hyperlipidemia Father    Hypertension Father    Heart disease  Maternal Grandfather    Hyperlipidemia Maternal Grandfather    Hypertension Maternal Grandfather    Heart attack Maternal Grandfather    Stroke Neg Hx    Diabetes Neg Hx    Cancer Neg Hx    Allergies as of 09/01/2022       Reactions   Compazine [prochlorperazine Edisylate] Anaphylaxis   Losartan    itch        Medication List        Accurate as of September 01, 2022  8:35 AM. If you have any questions, ask your nurse or doctor.          amitriptyline 25 MG tablet Commonly known as: ELAVIL TAKE 1 TO 2 TABLETS BY MOUTH EVERY DAY FOR IRRITABLE BOWEL SYNDROME   amLODipine 5 MG tablet Commonly known as: NORVASC Take 1 tablet (5 mg total) by mouth daily. Started by: Howard Pouch, DO   b complex vitamins tablet Take 1 tablet by mouth daily.   buPROPion 150 MG 24 hr tablet Commonly known as: Wellbutrin XL Take 1 tablet (150 mg total) by mouth daily.   cetirizine 10 MG tablet Commonly known as: ZYRTEC Take 10 mg by mouth daily.   losartan 25 MG tablet Commonly known as: COZAAR Take 1 tablet (25 mg total) by mouth daily.   multivitamin tablet Take 1 tablet by mouth daily.   pantoprazole 40 MG tablet Commonly known as: PROTONIX Take 1 tablet (40 mg total) by mouth daily.   tirzepatide 10 MG/0.5ML Pen Commonly known as: MOUNJARO Inject 10 mg into the skin once a week. What changed: Another medication with the same name was added. Make sure you understand how and when to take each. Changed by: Howard Pouch, DO   tirzepatide 7.5 MG/0.5ML Pen Commonly known as: MOUNJARO Inject 7.5 mg into the skin once a week. What changed: You were already taking a medication with the same name, and this prescription was added. Make sure you understand how and when to take each. Changed by: Howard Pouch, DO   vitamin B-12 500 MCG tablet Commonly known as: CYANOCOBALAMIN Take by mouth.        All past medical history, surgical history, allergies, family history,  immunizations andmedications were updated in the EMR today and reviewed under the history and medication portions of their EMR.     ROS Negative, with the exception of above mentioned in HPI   Objective:  BP (!) 149/90   Pulse 94   Temp 98.2 F (36.8 C)  Wt 244 lb 12.8 oz (111 kg)   SpO2 97%   BMI 35.63 kg/m  Body mass index is 35.63 kg/m. Physical Exam Vitals and nursing note reviewed.  Constitutional:      General: He is not in acute distress.    Appearance: Normal appearance. He is not ill-appearing, toxic-appearing or diaphoretic.  HENT:     Head: Normocephalic and atraumatic.  Eyes:     General: No scleral icterus.       Right eye: No discharge.        Left eye: No discharge.     Extraocular Movements: Extraocular movements intact.     Pupils: Pupils are equal, round, and reactive to light.  Cardiovascular:     Rate and Rhythm: Normal rate and regular rhythm.  Pulmonary:     Effort: Pulmonary effort is normal.     Breath sounds: Normal breath sounds.  Musculoskeletal:     Right lower leg: No edema.     Left lower leg: No edema.  Skin:    General: Skin is warm and dry.     Coloration: Skin is not jaundiced or pale.     Findings: No rash.  Neurological:     Mental Status: He is alert and oriented to person, place, and time. Mental status is at baseline.  Psychiatric:        Mood and Affect: Mood normal.        Behavior: Behavior normal.        Thought Content: Thought content normal.        Judgment: Judgment normal.     No results found. No results found. No results found for this or any previous visit (from the past 24 hour(s)).  Assessment/Plan: DEACAN SCHARFF is a 42 y.o. male present for OV for  Obesity with alveolar hypoventilation and body mass index (BMI) of 38 o/Weight loss counseling, encounter for/Morbid obesity with BMI of 40.0-44.9, adult [facility]) Patient has been counseled on exercise, calorie counting, weight loss and potential medications  to help with weight loss today. -Patient has been provided with online resources for: Weekly net calorie calculator.  Applications for calorie counting.  Patient was advised to ensure she is taking in adequate nutrition daily by meeting calorie goals. -Patient has been educated on dietary changes to not only lose weight but to eat healthy.   - Patient has been educated on glycemic index and foods to omit from his diet. -Patient has been educated on exercise goal of 150 minutes a week (plus warm up and cool down) of cardiovascular exercise.  He needs to work on exercise regimen as his goal before next appointment. -Patient has been encouraged to maintain adequate water consumption of at least 120 ounces a day, more if exercising/sweating. -He has been doing well with his diet. Continue Wellbutrin 150 mg twice daily  Type 2 diabetes mellitus with other specified complication, without long-term current use of insulin ([place]) With his history of IBS metformin is not ideal. Continue Mounjaro 10 mg daily.  Discussed diabetic diet and exercise - Hemoglobin A1c>6.3> 5.1> 5.0> die next visit!!!  Lost 40 pounds - has gained back since not able to find mounjaro in stock   OSA (obstructive sleep apnea) Weight loss encouraged to help improve condition.  Gastroesophageal reflux disease without esophagitis Weight loss encouraged to help improve symptoms. Continue Protonix 40 mg daily  Cigarette nicotine dependence without complication/Encounter for smoking cessation counseling Continue Wellbutrin.  Pick a quit date  HTN: Above  goal <130/80 Start amlodipine 5 mg qd Dc'd losartan - itch.  Low sodium diet  Reviewed expectations re: course of current medical issues. Discussed self-management of symptoms. Outlined signs and symptoms indicating need for more acute intervention. Patient verbalized understanding and all questions were answered. Patient received an After-Visit Summary.    No orders of  the defined types were placed in this encounter.  Meds ordered this encounter  Medications   buPROPion (WELLBUTRIN XL) 150 MG 24 hr tablet    Sig: Take 1 tablet (150 mg total) by mouth daily.    Dispense:  90 tablet    Refill:  1   amLODipine (NORVASC) 5 MG tablet    Sig: Take 1 tablet (5 mg total) by mouth daily.    Dispense:  90 tablet    Refill:  1   tirzepatide (MOUNJARO) 7.5 MG/0.5ML Pen    Sig: Inject 7.5 mg into the skin once a week.    Dispense:  6 mL    Refill:  0   Referral Orders  No referral(s) requested today     Note is dictated utilizing voice recognition software. Although note has been proof read prior to signing, occasional typographical errors still can be missed. If any questions arise, please do not hesitate to call for verification.   electronically signed by:  Howard Pouch, DO  [place]

## 2022-09-01 NOTE — Patient Instructions (Addendum)
Return in about 11 weeks (around 11/17/2022) for Routine chronic condition follow-up.        Great to see you today.  I have refilled the medication(s) we provide.   If labs were collected, we will inform you of lab results once received either by echart message or telephone call.   - echart message- for normal results that have been seen by the patient already.   - telephone call: abnormal results or if patient has not viewed results in their echart.

## 2022-09-03 ENCOUNTER — Telehealth: Payer: Self-pay | Admitting: Family Medicine

## 2022-09-03 ENCOUNTER — Other Ambulatory Visit (HOSPITAL_BASED_OUTPATIENT_CLINIC_OR_DEPARTMENT_OTHER): Payer: Self-pay

## 2022-09-03 MED ORDER — MOUNJARO 5 MG/0.5ML ~~LOC~~ SOAJ: 5.0000 mg | SUBCUTANEOUS | 2 refills | Status: DC

## 2022-09-03 MED ORDER — MOUNJARO 12.5 MG/0.5ML ~~LOC~~ SOAJ
12.5000 mg | SUBCUTANEOUS | 1 refills | Status: DC
  Filled 2022-09-30 (×2): qty 2, 28d supply, fill #0
  Filled 2022-10-26: qty 2, 28d supply, fill #1

## 2022-09-03 NOTE — Telephone Encounter (Signed)
noted 

## 2022-09-03 NOTE — Telephone Encounter (Signed)
Pt was able to locate 10 mg of Mounjaro in stock at the [facility] pharmacy.He would like to have that called in to there.

## 2022-09-04 ENCOUNTER — Other Ambulatory Visit (HOSPITAL_BASED_OUTPATIENT_CLINIC_OR_DEPARTMENT_OTHER): Payer: Self-pay

## 2022-09-25 DIAGNOSIS — H35363 Drusen (degenerative) of macula, bilateral: Secondary | ICD-10-CM | POA: Diagnosis not present

## 2022-09-25 DIAGNOSIS — E113299 Type 2 diabetes mellitus with mild nonproliferative diabetic retinopathy without macular edema, unspecified eye: Secondary | ICD-10-CM | POA: Diagnosis not present

## 2022-09-30 ENCOUNTER — Other Ambulatory Visit (HOSPITAL_BASED_OUTPATIENT_CLINIC_OR_DEPARTMENT_OTHER): Payer: Self-pay

## 2022-11-12 DIAGNOSIS — H35363 Drusen (degenerative) of macula, bilateral: Secondary | ICD-10-CM | POA: Diagnosis not present

## 2022-11-18 ENCOUNTER — Ambulatory Visit: Payer: BC Managed Care – PPO | Admitting: Family Medicine

## 2023-01-01 ENCOUNTER — Ambulatory Visit (INDEPENDENT_AMBULATORY_CARE_PROVIDER_SITE_OTHER): Payer: BC Managed Care – PPO | Admitting: Family Medicine

## 2023-01-01 ENCOUNTER — Encounter: Payer: Self-pay | Admitting: Family Medicine

## 2023-01-01 VITALS — BP 134/88 | HR 108 | Temp 98.0°F | Wt 235.4 lb

## 2023-01-01 DIAGNOSIS — E1169 Type 2 diabetes mellitus with other specified complication: Secondary | ICD-10-CM | POA: Diagnosis not present

## 2023-01-01 DIAGNOSIS — K219 Gastro-esophageal reflux disease without esophagitis: Secondary | ICD-10-CM

## 2023-01-01 DIAGNOSIS — F17210 Nicotine dependence, cigarettes, uncomplicated: Secondary | ICD-10-CM

## 2023-01-01 DIAGNOSIS — E662 Morbid (severe) obesity with alveolar hypoventilation: Secondary | ICD-10-CM

## 2023-01-01 DIAGNOSIS — Z7985 Long-term (current) use of injectable non-insulin antidiabetic drugs: Secondary | ICD-10-CM | POA: Diagnosis not present

## 2023-01-01 DIAGNOSIS — Z6841 Body Mass Index (BMI) 40.0 and over, adult: Secondary | ICD-10-CM

## 2023-01-01 DIAGNOSIS — G4733 Obstructive sleep apnea (adult) (pediatric): Secondary | ICD-10-CM

## 2023-01-01 DIAGNOSIS — N529 Male erectile dysfunction, unspecified: Secondary | ICD-10-CM | POA: Insufficient documentation

## 2023-01-01 DIAGNOSIS — N522 Drug-induced erectile dysfunction: Secondary | ICD-10-CM

## 2023-01-01 DIAGNOSIS — I10 Essential (primary) hypertension: Secondary | ICD-10-CM

## 2023-01-01 LAB — POCT GLYCOSYLATED HEMOGLOBIN (HGB A1C)
HbA1c POC (<> result, manual entry): 4.9 % (ref 4.0–5.6)
HbA1c, POC (controlled diabetic range): 4.9 % (ref 0.0–7.0)
HbA1c, POC (prediabetic range): 4.9 % — AB (ref 5.7–6.4)
Hemoglobin A1C: 4.9 % (ref 4.0–5.6)

## 2023-01-01 LAB — MICROALBUMIN / CREATININE URINE RATIO
Creatinine,U: 324.4 mg/dL
Microalb Creat Ratio: 0.9 mg/g (ref 0.0–30.0)
Microalb, Ur: 2.9 mg/dL — ABNORMAL HIGH (ref 0.0–1.9)

## 2023-01-01 MED ORDER — TIRZEPATIDE 10 MG/0.5ML ~~LOC~~ SOAJ
10.0000 mg | SUBCUTANEOUS | 1 refills | Status: AC
  Filled 2023-01-07: qty 6, 84d supply, fill #0
  Filled 2023-05-20: qty 6, 84d supply, fill #1

## 2023-01-01 MED ORDER — AMLODIPINE BESYLATE 2.5 MG PO TABS: 2.5000 mg | ORAL_TABLET | Freq: Every day | ORAL | 1 refills | Status: DC

## 2023-01-01 NOTE — Progress Notes (Signed)
Caleb Norris , 08-26-1980, 42 y.o., male MRN: [id number] Patient Care Team    Relationship Specialty Notifications Start End  Natalia Leatherwood, DO PCP - General Family Medicine  02/04/19   Waymon Budge, MD Consulting Physician Pulmonary Disease  01/04/19   Christia Reading, MD Consulting Physician Otolaryngology  02/04/19   Kerri Perches, MD Attending Physician Optometry  01/01/23    Comment: [facility]    Chief Complaint  Patient presents with   Diabetes     Subjective: Pt presents for an OV to obtain assistance to stop smoking and weight loss. Hypertension: Patient reports noncompliance with amlodipine 5 mg daily.  He is still smoking.  Patient denies chest pain, shortness of breath, dizziness or lower extremity edema.  Lost another 9 lbs.    Diabetes-obesity: He has serious comorbidities of OSA, GERD.   He is compliant Mounjaro 12.5 mg with  He still is not exercising routinely.  He has noticed since increasing to [facility] 12.5 he has had erectile dysfunction on occasion.  He states that 2 times that it did happen and have been on the day he took the injection.  Otherwise he is tolerating well.  He is making alterations to his diet cutting back on the sugar content and the complex carbohydrate content.   Daily life is rather sedentary.  He enjoys golfing on the weekends.        01/01/2023    7:48 AM 09/01/2022    8:25 AM 06/25/2022    9:46 AM 04/24/2022    8:13 AM 04/23/2021    8:04 AM  Depression screen PHQ 2/9  Decreased Interest 1 0 0 0 0  Down, Depressed, Hopeless 0 0 0 0 0  PHQ - 2 Score 1 0 0 0 0  Altered sleeping 1  1 1    Tired, decreased energy 1  0 1   Change in appetite 1  1 1    Feeling bad or failure about yourself  0  0 0   Trouble concentrating 0  0 0   Moving slowly or fidgety/restless 0  0 0   Suicidal thoughts 0  0 0   PHQ-9 Score 4  2 3    Difficult doing work/chores Somewhat difficult        Allergies  Allergen Reactions   Compazine  [Prochlorperazine Edisylate] Anaphylaxis   Losartan     itch   Social History   Social History Narrative   Married, 1 son and 1 daughter.   Educ: [employer] degree.   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   Tob: former, 15 pack-yr hx.  Quit 2011.   Alcohol: occasional.   Drugs: none.   Past Medical History:  Diagnosis Date   GERD (gastroesophageal reflux disease)    Headache    occassionally   History of kidney stones    IBS (irritable bowel syndrome)    Metabolic syndrome 2018   Trigs, HDL, waist circ   Obesity, Class I, BMI 30-34.9    OSA (obstructive sleep apnea) 12/2018   CPAP recommended 12/2018   Wears contact lenses    Wears glasses    Past Surgical History:  Procedure Laterality Date   CYSTOSCOPY/URETEROSCOPY/HOLMIUM LASER/STENT PLACEMENT Right 03/31/2019   Procedure: CYSTOSCOPY RIGHT RETROGRAE PYELOGRAM /URETEROSCOPY HOLMIUM LASER/STENT PLACEMENT;  Surgeon: Bjorn Pippin, MD;  Location: [facility] [place];  Service: Urology;  Laterality: Right;   LEG SURGERY Right    MULTIPLE TOOTH EXTRACTIONS  THYROGLOSSAL DUCT CYST Left 03/16/2015   Procedure: Excision Left neck cyst;  Surgeon: Christia Reading, MD;  Location: [facility] OR;  Service: ENT;  Laterality: Left;   Family History  Problem Relation Age of Onset   Hyperlipidemia Father    Hypertension Father    Heart disease Maternal Grandfather    Hyperlipidemia Maternal Grandfather    Hypertension Maternal Grandfather    Heart attack Maternal Grandfather    Stroke Neg Hx    Diabetes Neg Hx    Cancer Neg Hx    Allergies as of 01/01/2023       Reactions   Compazine [prochlorperazine Edisylate] Anaphylaxis   Losartan    itch        Medication List        Accurate as of January 01, 2023 12:00 PM. If you have any questions, ask your nurse or doctor.          STOP taking these medications    amitriptyline 25 MG tablet Commonly known as: ELAVIL Stopped by: Caleb Norris   buPROPion 150 MG 24  hr tablet Commonly known as: Wellbutrin XL Stopped by: Caleb Norris   losartan 25 MG tablet Commonly known as: COZAAR Stopped by: Caleb Norris   pantoprazole 40 MG tablet Commonly known as: PROTONIX Stopped by: Caleb Norris       TAKE these medications    amLODipine 2.5 MG tablet Commonly known as: NORVASC Take 1 tablet (2.5 mg total) by mouth daily. What changed:  medication strength how much to take Changed by: Caleb Norris   b complex vitamins tablet Take 1 tablet by mouth daily.   cetirizine 10 MG tablet Commonly known as: ZYRTEC Take 10 mg by mouth daily.   multivitamin tablet Take 1 tablet by mouth daily.   tirzepatide 10 MG/0.5ML Pen Commonly known as: MOUNJARO Inject 10 mg into the skin once a week. What changed: Another medication with the same name was removed. Continue taking this medication, and follow the directions you see here. Changed by: Caleb Norris   vitamin B-12 500 MCG tablet Commonly known as: CYANOCOBALAMIN Take by mouth.        All past medical history, surgical history, allergies, family history, immunizations andmedications were updated in the EMR today and reviewed under the history and medication portions of their EMR.     ROS Negative, with the exception of above mentioned in HPI   Objective:  BP 134/88   Pulse (!) 108   Temp 98 F (36.7 C)   Wt 235 lb 6.4 oz (106.8 kg)   SpO2 98%   BMI 34.26 kg/m  Body mass index is 34.26 kg/m. Physical Exam Vitals and nursing note reviewed.  Constitutional:      General: He is not in acute distress.    Appearance: Normal appearance. He is not ill-appearing, toxic-appearing or diaphoretic.  HENT:     Head: Normocephalic and atraumatic.  Eyes:     General: No scleral icterus.       Right eye: No discharge.        Left eye: No discharge.     Extraocular Movements: Extraocular movements intact.     Pupils: Pupils are equal, round, and reactive to light.  Cardiovascular:      Rate and Rhythm: Normal rate and regular rhythm.     Heart sounds: No murmur heard. Pulmonary:     Effort: Pulmonary effort is normal.     Breath sounds: Normal breath sounds.  Musculoskeletal:  Right lower leg: No edema.     Left lower leg: No edema.  Skin:    General: Skin is warm and dry.     Coloration: Skin is not jaundiced or pale.     Findings: No rash.  Neurological:     Mental Status: He is alert and oriented to person, place, and time. Mental status is at baseline.  Psychiatric:        Mood and Affect: Mood normal.        Behavior: Behavior normal.        Thought Content: Thought content normal.        Judgment: Judgment normal.     No results found. No results found. Results for orders placed or performed in visit on 01/01/23 (from the past 24 hour(s))  POCT HgB A1C     Status: Abnormal   Collection Time: 01/01/23  7:52 AM  Result Value Ref Range   Hemoglobin A1C 4.9 4.0 - 5.6 %   HbA1c POC (<> result, manual entry) 4.9 4.0 - 5.6 %   HbA1c, POC (prediabetic range) 4.9 (A) 5.7 - 6.4 %   HbA1c, POC (controlled diabetic range) 4.9 0.0 - 7.0 %    Assessment/Plan: YOBANI SCHERTZER is a 42 y.o. male present for OV for  Obesity with alveolar hypoventilation and body mass index (BMI) of 38 o/Weight loss counseling, encounter for/Morbid obesity with BMI of 40.0-44.9, adult (HCC) Patient has been counseled on exercise, calorie counting, weight loss and potential medications to help with weight loss today. -Patient has been provided with online resources for: Weekly net calorie calculator.  Applications for calorie counting.  Patient was advised to ensure she is taking in adequate nutrition daily by meeting calorie goals. -Patient has been educated on dietary changes to not only lose weight but to eat healthy.   - Patient has been educated on glycemic index and foods to omit from his diet. -Patient has been educated on exercise goal of 150 minutes a week (plus warm up and  cool down) of cardiovascular exercise.  He needs to work on exercise regimen as his goal before next appointment. -Patient has been encouraged to maintain adequate water consumption of at least 120 ounces a day, more if exercising/sweating. -He has been doing well with his diet.   Type 2 diabetes mellitus with other specified complication, without long-term current use of insulin (HCC) With his history of IBS metformin is not ideal. Decrease to Mounjaro 10 mg daily.  Discussed diabetic diet and exercise - Hemoglobin A1c>6.3> 5.1> 5.0>4.9  Microalb- ordered Eye-up to date 09/2022 at [facility] eye.  Records requested.  Moving forward he will schedule at our diabetic eye clinic  OSA (obstructive sleep apnea) Weight loss encouraged to help improve condition.  ED: May be secondary to mounjaro 12.5 mg, but had occurred prior to this dose also- but not as frequent . Decreasing mounjaro dose and hopefull will be able to taper down now that he has  lost weight, A1c has greatly improved  HTN: Above goal <130/80.  Noncompliant with amlodipine 5 mg daily.  We discussed the goal again today and since he is lost more weight I believe he still requires a blood pressure medication, but the dose can be decreased Start amlodipine 2.5 mg qd Tried: Losartan (itch) Low sodium diet Stop smoking  Reviewed expectations re: course of current medical issues. Discussed self-management of symptoms. Outlined signs and symptoms indicating need for more acute intervention. Patient verbalized understanding and all  questions were answered. Patient received an After-Visit Summary.    Orders Placed This Encounter  Procedures   Urine Albumin/Creatinine with ratio (send out) [LAB689]   POCT HgB A1C   Meds ordered this encounter  Medications   amLODipine (NORVASC) 2.5 MG tablet    Sig: Take 1 tablet (2.5 mg total) by mouth daily.    Dispense:  90 tablet    Refill:  1   tirzepatide (MOUNJARO) 10 MG/0.5ML Pen     Sig: Inject 10 mg into the skin once a week.    Dispense:  6 mL    Refill:  1   Referral Orders  No referral(s) requested today     Note is dictated utilizing voice recognition software. Although note has been proof read prior to signing, occasional typographical errors still can be missed. If any questions arise, please do not hesitate to call for verification.   electronically signed by:  Caleb Pacini, DO  [facility] - [place]

## 2023-01-01 NOTE — Patient Instructions (Addendum)
Return in about 4 months (around 04/27/2023).        Great to see you today.  I have refilled the medication(s) we provide.   If labs were collected, we will inform you of lab results once received either by echart message or telephone call.   - echart message- for normal results that have been seen by the patient already.   - telephone call: abnormal results or if patient has not viewed results in their echart.

## 2023-01-02 ENCOUNTER — Telehealth: Payer: Self-pay

## 2023-01-02 NOTE — Telephone Encounter (Signed)
Caleb Norris (Key: [id number]) Need Help? Call us at [phone] Status: Sent to Plan today Drug: Greggory Keen 2.5MG /0.5ML pen-injectors Form :[employer] [place] [employer] Form

## 2023-01-07 ENCOUNTER — Telehealth: Payer: Self-pay | Admitting: Family Medicine

## 2023-01-07 ENCOUNTER — Other Ambulatory Visit (HOSPITAL_BASED_OUTPATIENT_CLINIC_OR_DEPARTMENT_OTHER): Payer: Self-pay

## 2023-01-07 NOTE — Telephone Encounter (Signed)
Prescription Request Prescription Request  01/07/2023  LOV: 01/01/2023  What is the name of the medication or equipment? tirzepatide [facility]) 10 MG/0.5ML Pen   Have you contacted your pharmacy to request a refill? Yes   Which pharmacy would you like this sent to? [facility] [address] [facility]    Patient notified that their request is being sent to the clinical staff for review and that they should receive a response within 2 business days.   Please advise at Mobile [phone] (mobile)

## 2023-01-07 NOTE — Telephone Encounter (Signed)
Transfer requested from [facility]

## 2023-03-03 ENCOUNTER — Telehealth: Payer: Self-pay

## 2023-03-03 DIAGNOSIS — Z136 Encounter for screening for cardiovascular disorders: Secondary | ICD-10-CM | POA: Diagnosis not present

## 2023-03-03 DIAGNOSIS — R519 Headache, unspecified: Secondary | ICD-10-CM | POA: Diagnosis not present

## 2023-03-03 DIAGNOSIS — R0789 Other chest pain: Secondary | ICD-10-CM | POA: Diagnosis not present

## 2023-03-03 NOTE — Telephone Encounter (Signed)
Pt advised to be seen in ED or UC. Also to schedule appt with PCP. Pt has not been on medication for over 2 weeks due to have reaction to medication.

## 2023-03-03 NOTE — Telephone Encounter (Signed)
Please document advice given to patient  [facility] Day - Client TELEPHONE ADVICE RECORD AccessNurse Patient Name First: Caleb Last: Norris Gender: Male DOB: [birth year] Age: 42 Y 4 M 9 D Return Phone Number: [phone] (Secondary) Address: [place]/ [place]/ [place]  [address] Client [facility] Day - Client Client Site [facility] - Day Provider [place], [place] Contact Type Call Who Is Calling Patient / Member / Family / Caregiver Call Type Triage / Clinical Relationship To Patient Self Return Phone Number [phone] (Secondary) Chief Complaint Headache Reason for Call Symptomatic / Request for Health Information Initial Comment His BP 190/105, has headache since saturday. Translation No Nurse Assessment Nurse: Carylon Perches, RN, Hilda Lias Date/Time Lamount Cohen Time): 03/03/2023 8:22:48 AM Confirm and document reason for call. If symptomatic, describe symptoms. ---Caller states his bp is 190/105, it was over 200 last night. He also has a headache. He doesn't take his amlodipine because it makes him itch terribly. Does the patient have any new or worsening symptoms? ---Yes Will a triage be completed? ---Yes Related visit to physician within the last 2 weeks? ---No Does the PT have any chronic conditions? (i.e. diabetes, asthma, this includes High risk factors for pregnancy, etc.) ---Yes List chronic conditions. ---HTN, GERD Is this a behavioral health or substance abuse call? ---No Guidelines Guideline Title Affirmed Question Affirmed Notes Nurse Date/Time (Eastern Time) Blood Pressure - High [1] Systolic BP >= 160 OR Diastolic >= 100 AND [2] cardiac (e.g., breathing difficulty, chest pain) or neurologic symptoms (e.g., new-onset blurred or double vision, unsteady gait) Carylon Perches, RN, Hilda Lias 03/03/2023 8:25:09 AM PLEASE NOTE: All timestamps contained within this report are represented as [place] Standard  Time. CONFIDENTIALTY NOTICE: This fax transmission is intended only for the addressee. It contains information that is legally privileged, confidential or otherwise protected from use or disclosure. If you are not the intended recipient, you are strictly prohibited from reviewing, disclosing, copying using or disseminating any of this information or taking any action in reliance on or regarding this information. If you have received this fax in error, please notify us immediately by telephone so that we can arrange for its return to [place]. Phone: [phone], Toll-Free: [phone], Fax: [phone] Page: 2 of 2 Call Id: [id number] Disp. Time Lamount Cohen Time) Disposition Final User 03/03/2023 8:28:44 AM Go to ED Now Yes Carylon Perches, RN, Hilda Lias Final Disposition 03/03/2023 8:28:44 AM Go to ED Now Yes Carylon Perches, RN, [place] Grater Disagree/Comply Disagree Caller Understands Yes PreDisposition Call Doctor Care Advice Given Per Guideline GO TO ED NOW: * You need to be seen in the Emergency Department. * Leave now. Drive carefully. * Another adult should drive. NOTE TO TRIAGER - DRIVING: CARE ADVICE given per Blood Pressure - High (Adult) guideline. Comments User: Laverta Baltimore, RN Date/Time Lamount Cohen Time): 03/03/2023 8:27:15 AM Caller states he is also having intermittent chest pain since last night. Referrals GO TO FACILITY UNDECIDED

## 2023-03-04 ENCOUNTER — Ambulatory Visit (INDEPENDENT_AMBULATORY_CARE_PROVIDER_SITE_OTHER): Payer: BC Managed Care – PPO | Admitting: Family Medicine

## 2023-03-04 ENCOUNTER — Encounter: Payer: Self-pay | Admitting: Family Medicine

## 2023-03-04 VITALS — BP 134/80 | HR 94 | Temp 98.0°F | Wt 227.4 lb

## 2023-03-04 DIAGNOSIS — I10 Essential (primary) hypertension: Secondary | ICD-10-CM | POA: Diagnosis not present

## 2023-03-04 DIAGNOSIS — F17210 Nicotine dependence, cigarettes, uncomplicated: Secondary | ICD-10-CM

## 2023-03-04 MED ORDER — LISINOPRIL 5 MG PO TABS: 5.0000 mg | ORAL_TABLET | Freq: Every day | ORAL | 1 refills | Status: DC

## 2023-03-04 NOTE — Progress Notes (Signed)
Caleb Norris , 10/17/1980, 42 y.o., male MRN: [id number] Patient Care Team    Relationship Specialty Notifications Start End  Natalia Leatherwood, DO PCP - General Family Medicine  02/04/19   Waymon Budge, MD Consulting Physician Pulmonary Disease  01/04/19   Christia Reading, MD Consulting Physician Otolaryngology  02/04/19   Kerri Perches, MD Attending Physician Optometry  01/01/23    Comment: [facility]    Chief Complaint  Patient presents with   Hypertension    AM - 174/99     Subjective: Caleb Norris is a 42 y.o. Pt presents for an OV with complaints of elevated BP. He stopped  amlodipine because it made him itch. He also had the same complaint with the use of losartan.   Hypertension: Patient reported having high blood pressure in the 190s/90s yesterday.  He was seen at the urgent care and he reports it was in the 170s systolic while he was there.  He reports they did an EKG and ruled out he was having a heart attack from his chest discomfort he reported.  He states this all started on Sunday he was not feeling well woke up with a headache and he took a over-the-counter sinus medication.  The following day he felt off as well so he took his blood pressure and noted it was high.  Rechecks thereafter had been consistently high and even higher.  He then noticed some chest discomfort, he does not describe his pain.  He has been prescribed amlodipine 2.5 mg daily, but he states it was making him itch so he was not taking it. Patient denies chest pain, shortness of breath, dizziness or lower extremity edema.  He is still smoking.       03/04/2023    2:56 PM 01/01/2023    7:48 AM 09/01/2022    8:25 AM 06/25/2022    9:46 AM 04/24/2022    8:13 AM  Depression screen PHQ 2/9  Decreased Interest 1 1 0 0 0  Down, Depressed, Hopeless 0 0 0 0 0  PHQ - 2 Score 1 1 0 0 0  Altered sleeping 1 1  1 1   Tired, decreased energy 1 1  0 1  Change in appetite 0 1  1 1   Feeling bad  or failure about yourself  0 0  0 0  Trouble concentrating 1 0  0 0  Moving slowly or fidgety/restless 0 0  0 0  Suicidal thoughts 0 0  0 0  PHQ-9 Score 4 4  2 3   Difficult doing work/chores Somewhat difficult Somewhat difficult       Allergies  Allergen Reactions   Compazine [Prochlorperazine Edisylate] Anaphylaxis   Amlodipine Other (See Comments)    itch   Losartan     itch   Social History   Social History Narrative   Married, 1 son and 1 daughter.   Educ: [employer] degree.   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   Tob: former, 15 pack-yr hx.  Quit 2011.   Alcohol: occasional.   Drugs: none.   Past Medical History:  Diagnosis Date   GERD (gastroesophageal reflux disease)    Headache    occassionally   History of kidney stones    IBS (irritable bowel syndrome)    Metabolic syndrome 2018   Trigs, HDL, waist circ   Obesity, Class I, BMI 30-34.9    OSA (obstructive sleep apnea) 12/2018   CPAP  recommended 12/2018   Wears contact lenses    Wears glasses    Past Surgical History:  Procedure Laterality Date   CYSTOSCOPY/URETEROSCOPY/HOLMIUM LASER/STENT PLACEMENT Right 03/31/2019   Procedure: CYSTOSCOPY RIGHT RETROGRAE PYELOGRAM /URETEROSCOPY HOLMIUM LASER/STENT PLACEMENT;  Surgeon: Bjorn Pippin, MD;  [facility];  Service: Urology;  Laterality: Right;   LEG SURGERY Right    MULTIPLE TOOTH EXTRACTIONS     THYROGLOSSAL DUCT CYST Left 03/16/2015   Procedure: Excision Left neck cyst;  Surgeon: Christia Reading, MD;  Location: [facility] OR;  Service: ENT;  Laterality: Left;   Family History  Problem Relation Age of Onset   Hyperlipidemia Father    Hypertension Father    Heart disease Maternal Grandfather    Hyperlipidemia Maternal Grandfather    Hypertension Maternal Grandfather    Heart attack Maternal Grandfather    Stroke Neg Hx    Diabetes Neg Hx    Cancer Neg Hx    Allergies as of 03/04/2023       Reactions   Compazine  [prochlorperazine Edisylate] Anaphylaxis   Amlodipine Other (See Comments)   itch   Losartan    itch        Medication List        Accurate as of March 04, 2023  4:50 PM. If you have any questions, ask your nurse or doctor.          STOP taking these medications    amLODipine 2.5 MG tablet Commonly known as: NORVASC Stopped by: Felix Pacini       TAKE these medications    b complex vitamins tablet Take 1 tablet by mouth daily.   cetirizine 10 MG tablet Commonly known as: ZYRTEC Take 10 mg by mouth daily.   lisinopril 5 MG tablet Commonly known as: ZESTRIL Take 1 tablet (5 mg total) by mouth daily. Started by: Jacquiline Doe 10 MG/0.5ML Pen Generic drug: tirzepatide Inject 10 mg into the skin once a week.   multivitamin tablet Take 1 tablet by mouth daily.   vitamin B-12 500 MCG tablet Commonly known as: CYANOCOBALAMIN Take by mouth.        All past medical history, surgical history, allergies, family history, immunizations andmedications were updated in the EMR today and reviewed under the history and medication portions of their EMR.     Review of Systems  Constitutional:  Negative for chills, fever and malaise/fatigue.  HENT:  Negative for congestion, sinus pain and sore throat.   Eyes:  Negative for discharge.  Respiratory:  Negative for cough.   Musculoskeletal:  Negative for myalgias.  Neurological:  Positive for headaches.   Negative, with the exception of above mentioned in HPI   Objective:  BP 134/80   Pulse 94   Temp 98 F (36.7 C)   Wt 227 lb 6.4 oz (103.1 kg)   SpO2 98%   BMI 33.10 kg/m  Body mass index is 33.1 kg/m. Physical Exam Vitals and nursing note reviewed.  Constitutional:      General: He is not in acute distress.    Appearance: Normal appearance. He is not ill-appearing, toxic-appearing or diaphoretic.  HENT:     Head: Normocephalic and atraumatic.  Eyes:     General: No scleral icterus.        Right eye: No discharge.        Left eye: No discharge.     Extraocular Movements: Extraocular movements intact.     Pupils: Pupils are equal, round,  and reactive to light.  Cardiovascular:     Rate and Rhythm: Normal rate and regular rhythm.     Heart sounds: No murmur heard. Pulmonary:     Effort: Pulmonary effort is normal. No respiratory distress.     Breath sounds: Normal breath sounds. No wheezing, rhonchi or rales.  Musculoskeletal:     Cervical back: Neck supple. No tenderness.     Right lower leg: No edema.     Left lower leg: No edema.  Skin:    General: Skin is warm.     Findings: No rash.  Neurological:     Mental Status: He is alert and oriented to person, place, and time. Mental status is at baseline.  Psychiatric:        Mood and Affect: Mood normal.        Behavior: Behavior normal.        Thought Content: Thought content normal.        Judgment: Judgment normal.     No results found. No results found. No results found for this or any previous visit (from the past 24 hour(s)).  Assessment/Plan: Caleb Norris is a 42 y.o. male present for OV for  Hypertension: -Blood pressure today is not bad.  He had restarted the amlodipine to help with the blood pressure over the last 3 days, but it is causing itching.  Stop amlodipine altogether. - start lisinopril 5 mg QD - bring cuff next appt. to check. Discuss proper technique.  - stop smoking.  Cardiac CT screening ordered today Nurse visit 2 weeks BP recheck  Reviewed expectations re: course of current medical issues. Discussed self-management of symptoms. Outlined signs and symptoms indicating need for more acute intervention. Patient verbalized understanding and all questions were answered. Patient received an After-Visit Summary.    Orders Placed This Encounter  Procedures   CT CARDIAC SCORING (SELF PAY ONLY)   Meds ordered this encounter  Medications   lisinopril (ZESTRIL) 5 MG tablet    Sig:  Take 1 tablet (5 mg total) by mouth daily.    Dispense:  90 tablet    Refill:  1   Referral Orders  No referral(s) requested today     Note is dictated utilizing voice recognition software. Although note has been proof read prior to signing, occasional typographical errors still can be missed. If any questions arise, please do not hesitate to call for verification.   electronically signed by:  Felix Pacini, DO  [facility] - [place]

## 2023-03-04 NOTE — Patient Instructions (Addendum)
Return in about 2 weeks (around 03/18/2023) for bp recheck.        Great to see you today.  I have refilled the medication(s) we provide.   If labs were collected or images ordered, we will inform you of  results once we have received them and reviewed. We will contact you either by echart message, or telephone call.  Please give ample time to the testing facility, and our office to run,  receive and review results. Please do not call inquiring of results, even if you can see them in your chart. We will contact you as soon as we are able. If it has been over 1 week since the test was completed, and you have not yet heard from [place], then please call us.    - echart message- for normal results that have been seen by the patient already.   - telephone call: abnormal results or if patient has not viewed results in their echart.  If a referral to a specialist was entered for you, please call us in 2 weeks if you have not heard from the specialist office to schedule.

## 2023-03-09 ENCOUNTER — Other Ambulatory Visit: Payer: BC Managed Care – PPO

## 2023-03-09 ENCOUNTER — Ambulatory Visit (INDEPENDENT_AMBULATORY_CARE_PROVIDER_SITE_OTHER): Payer: Self-pay

## 2023-03-09 ENCOUNTER — Telehealth: Payer: Self-pay | Admitting: Family Medicine

## 2023-03-09 DIAGNOSIS — I10 Essential (primary) hypertension: Secondary | ICD-10-CM

## 2023-03-09 DIAGNOSIS — F17210 Nicotine dependence, cigarettes, uncomplicated: Secondary | ICD-10-CM

## 2023-03-09 DIAGNOSIS — R931 Abnormal findings on diagnostic imaging of heart and coronary circulation: Secondary | ICD-10-CM | POA: Insufficient documentation

## 2023-03-09 MED ORDER — ATORVASTATIN CALCIUM 10 MG PO TABS: ORAL_TABLET | ORAL | 3 refills | Status: AC

## 2023-03-09 NOTE — Addendum Note (Signed)
Addended by: Felix Pacini A on: 03/09/2023 03:34 PM   Modules accepted: Orders

## 2023-03-09 NOTE — Telephone Encounter (Signed)
Pt okay to start statin

## 2023-03-09 NOTE — Telephone Encounter (Signed)
Please call patient: Coronary Calcium Score results: Total: 8.04 Percentile: 81st for age/male etc. This is higher than would be expected for his age, but not considered HIGH risk.     If score is >75th percentile, it is reasonable to initiate statin therapy at any age for cardiovascular protection and decrease plaque build up from forming. If he would like to start a statin, I will call this in for him.  Since his cholesterol has been good, he would only need to take a low dose twice weekly.  Please advise on his decision

## 2023-03-09 NOTE — Telephone Encounter (Signed)
Atorvastatin x 2 weekly prescribed

## 2023-03-17 ENCOUNTER — Ambulatory Visit: Payer: BC Managed Care – PPO

## 2023-03-17 NOTE — Progress Notes (Signed)
Pt in for blood pressure check per Dr Claiborne Billings  Pts first reading was 160/100. Pt stated he took his Lisinopril about 2 hours ago but had smoked before he arrived.  Rechecked bp shortly after and it was 140/100. Recommendations given to pt and pt advised to follow up in a week for recheck.

## 2023-04-01 ENCOUNTER — Telehealth: Payer: Self-pay | Admitting: Family Medicine

## 2023-04-01 ENCOUNTER — Telehealth: Payer: Self-pay

## 2023-04-01 ENCOUNTER — Ambulatory Visit: Payer: BC Managed Care – PPO

## 2023-04-01 MED ORDER — LISINOPRIL 20 MG PO TABS: 20.0000 mg | ORAL_TABLET | Freq: Every day | ORAL | 1 refills | Status: DC

## 2023-04-01 NOTE — Progress Notes (Signed)
Pt in for Bp recheck per Dr. Claiborne Billings.  1st reading at 8:45 with his home monitor  was 169/111 with a pulse of 90.  2nd check manual at 8:47 was 160/100 with a pulse of 97.  3rd recheck at 8:54 which was also manual was 150/100 with a pulse of 95.   Pt advised we would call him later with recommendations.

## 2023-04-01 NOTE — Telephone Encounter (Signed)
Left pt a VM to return my call. Will also send message in mychart.

## 2023-04-01 NOTE — Telephone Encounter (Signed)
Pt advised of new meds and to schedule 4 week follow up.

## 2023-04-01 NOTE — Telephone Encounter (Signed)
Pt in for Bp recheck per Dr. Claiborne Billings.   1st reading at 8:45 with his home monitor  was 169/111 with a pulse of 90.   2nd check manual at 8:47 was 160/100 with a pulse of 97.   3rd recheck at 8:54 which was also manual was 150/100 with a pulse of 95.    Pt advised we would call him later with recommendations.         _____________________________________________________________ Please call patient and inform him I have increased his lisinopril to 20 mg daily.  I have called in a new prescription for him. Follow-up in 4 weeks with provider

## 2023-04-14 DIAGNOSIS — Z8639 Personal history of other endocrine, nutritional and metabolic disease: Secondary | ICD-10-CM | POA: Diagnosis not present

## 2023-04-14 DIAGNOSIS — I10 Essential (primary) hypertension: Secondary | ICD-10-CM | POA: Diagnosis not present

## 2023-04-14 DIAGNOSIS — Z1330 Encounter for screening examination for mental health and behavioral disorders, unspecified: Secondary | ICD-10-CM | POA: Diagnosis not present

## 2023-04-17 ENCOUNTER — Encounter: Payer: Self-pay | Admitting: Cardiology

## 2023-04-17 ENCOUNTER — Ambulatory Visit: Payer: BC Managed Care – PPO | Attending: Cardiology | Admitting: Cardiology

## 2023-04-17 VITALS — BP 140/92 | HR 98 | Ht 70.0 in | Wt 231.0 lb

## 2023-04-17 DIAGNOSIS — I10 Essential (primary) hypertension: Secondary | ICD-10-CM

## 2023-04-17 DIAGNOSIS — Z01812 Encounter for preprocedural laboratory examination: Secondary | ICD-10-CM | POA: Diagnosis not present

## 2023-04-17 DIAGNOSIS — R072 Precordial pain: Secondary | ICD-10-CM

## 2023-04-17 LAB — BASIC METABOLIC PANEL
BUN/Creatinine Ratio: 14 (ref 9–20)
BUN: 9 mg/dL (ref 6–24)
CO2: 24 mmol/L (ref 20–29)
Calcium: 9.2 mg/dL (ref 8.7–10.2)
Chloride: 103 mmol/L (ref 96–106)
Creatinine, Ser: 0.66 mg/dL — ABNORMAL LOW (ref 0.76–1.27)
Glucose: 79 mg/dL (ref 70–99)
Potassium: 4.2 mmol/L (ref 3.5–5.2)
Sodium: 139 mmol/L (ref 134–144)
eGFR: 120 mL/min/{1.73_m2} (ref >59)

## 2023-04-17 MED ORDER — HYDROCHLOROTHIAZIDE 25 MG PO TABS: 25.0000 mg | ORAL_TABLET | Freq: Every day | ORAL | 3 refills | Status: DC

## 2023-04-17 MED ORDER — METOPROLOL TARTRATE 100 MG PO TABS: 100.0000 mg | ORAL_TABLET | ORAL | 0 refills | Status: AC

## 2023-04-17 MED ORDER — LISINOPRIL 40 MG PO TABS
40.0000 mg | ORAL_TABLET | Freq: Every day | ORAL | 3 refills | Status: DC
Start: 2023-04-17 — End: 2024-03-01
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
## 2023-04-17 NOTE — Patient Instructions (Signed)
Medication Instructions:  Please start Hydrochlorothiazide 25 mg a day. Continue Lisinopril 40 mg daily and all other medications as listed.   *If you need a refill on your cardiac medications before your next appointment, please call your pharmacy*   Lab Work: Please have blood work today (BMP)  If you have labs (blood work) drawn today and your tests are completely normal, you will receive your results only by: MyChart Message (if you have MyChart) OR A paper copy in the mail If you have any lab test that is abnormal or we need to change your treatment, we will call you to review the results.   Testing/Procedures:   Your cardiac CT will be scheduled at:   [facility] [address] [place], [place] [address] [phone]  Please arrive at the [facility] and Children's Entrance (Entrance C2) of [facility] 30 minutes prior to test start time. You can use the FREE valet parking offered at entrance C (encouraged to control the heart rate for the test)  Proceed to the [facility] Radiology Department (first floor) to check-in and test prep.  All radiology patients and guests should use entrance C2 at [facility], accessed from [facility], even though the hospital's physical address listed is [address]     Please follow these instructions carefully (unless otherwise directed):  An IV will be required for this test and Nitroglycerin will be given.  Hold all erectile dysfunction medications at least 3 days (72 hrs) prior to test. (Ie viagra, cialis, sildenafil, tadalafil, etc)   On the Night Before the Test: Be sure to Drink plenty of water. Do not consume any caffeinated/decaffeinated beverages or chocolate 12 hours prior to your test. Do not take any antihistamines 12 hours prior to your test.  On the Day of the Test: Drink plenty of water until 1 hour prior to the test. Do not eat any food 1 hour prior to test. You may  take your regular medications prior to the test.  Take metoprolol (Lopressor) two hours prior to test. If you take Furosemide/Hydrochlorothiazide/Spironolactone, please HOLD on the morning of the test.      After the Test: Drink plenty of water. After receiving IV contrast, you may experience a mild flushed feeling. This is normal. On occasion, you may experience a mild rash up to 24 hours after the test. This is not dangerous. If this occurs, you can take Benadryl 25 mg and increase your fluid intake. If you experience trouble breathing, this can be serious. If it is severe call 911 IMMEDIATELY. If it is mild, please call our office. If you take any of these medications: Glipizide/Metformin, Avandament, Glucavance, please do not take 48 hours after completing test unless otherwise instructed.  We will call to schedule your test 2-4 weeks out understanding that some insurance companies will need an authorization prior to the service being performed.   For more information and frequently asked questions, please visit our website : [email or web address]  For non-scheduling related questions, please contact the cardiac imaging nurse navigator should you have any questions/concerns: Cardiac Imaging Nurse Navigators Direct Office Dial: [phone]   For scheduling needs, including cancellations and rescheduling, please call [place], [phone].   Follow-Up: At [facility], you and your health needs are our priority.  As part of our continuing mission to provide you with exceptional heart care, we have created designated Provider Care Teams.  These Care Teams include your primary Cardiologist (physician) and  Advanced Practice Providers (APPs -  Physician Assistants and Nurse Practitioners) who all work together to provide you with the care you need, when you need it.  We recommend signing up for the patient portal called "MyChart".  Sign up information is provided on this  After Visit Summary.  MyChart is used to connect with patients for Virtual Visits (Telemedicine).  Patients are able to view lab/test results, encounter notes, upcoming appointments, etc.  Non-urgent messages can be sent to your provider as well.   To learn more about what you can do with MyChart, go to [email or web address].    Your next appointment:   1 month(s)  Provider:   Hypertension [facility] at [facility].

## 2023-04-27 ENCOUNTER — Encounter: Payer: Self-pay | Admitting: Family Medicine

## 2023-04-27 ENCOUNTER — Ambulatory Visit (INDEPENDENT_AMBULATORY_CARE_PROVIDER_SITE_OTHER): Payer: Self-pay | Admitting: Family Medicine

## 2023-04-27 DIAGNOSIS — E1169 Type 2 diabetes mellitus with other specified complication: Secondary | ICD-10-CM

## 2023-04-27 DIAGNOSIS — R931 Abnormal findings on diagnostic imaging of heart and coronary circulation: Secondary | ICD-10-CM

## 2023-04-27 DIAGNOSIS — Z91199 Patient's noncompliance with other medical treatment and regimen due to unspecified reason: Secondary | ICD-10-CM

## 2023-04-27 DIAGNOSIS — I10 Essential (primary) hypertension: Secondary | ICD-10-CM

## 2023-04-27 DIAGNOSIS — Z6841 Body Mass Index (BMI) 40.0 and over, adult: Secondary | ICD-10-CM

## 2023-04-27 DIAGNOSIS — G4733 Obstructive sleep apnea (adult) (pediatric): Secondary | ICD-10-CM

## 2023-04-27 NOTE — Progress Notes (Signed)
No show

## 2023-04-27 NOTE — Patient Instructions (Addendum)
Return in about 16 weeks (around 08/17/2023) for cpe (20 min), Routine chronic condition follow-up.   Next visit will be your physical and routine chronic condition appointment combined.  Yearly labs will be collected during that visit.  If able, please fast for that appointment at least 6-9 hours.     Great to see you today.  I have refilled the medication(s) we provide.   If labs were collected or images ordered, we will inform you of  results once we have received them and reviewed. We will contact you either by echart message, or telephone call.  Please give ample time to the testing facility, and our office to run,  receive and review results. Please do not call inquiring of results, even if you can see them in your chart. We will contact you as soon as we are able. If it has been over 1 week since the test was completed, and you have not yet heard from [place], then please call us.    - echart message- for normal results that have been seen by the patient already.   - telephone call: abnormal results or if patient has not viewed results in their echart.  If a referral to a specialist was entered for you, please call us in 2 weeks if you have not heard from the specialist office to schedule.

## 2023-04-29 ENCOUNTER — Encounter (HOSPITAL_COMMUNITY): Payer: Self-pay

## 2023-04-30 ENCOUNTER — Telehealth (HOSPITAL_COMMUNITY): Payer: Self-pay | Admitting: Emergency Medicine

## 2023-04-30 NOTE — Telephone Encounter (Signed)
Reaching out to patient to offer assistance regarding upcoming cardiac imaging study; pt verbalizes understanding of appt date/time, parking situation and where to check in, pre-test NPO status and medications ordered, and verified current allergies; name and call back number provided for further questions should they arise Marchia Bond RN Navigator Cardiac Imaging [facility] [phone] office [phone] cell  100mg  metoprolol

## 2023-05-01 ENCOUNTER — Ambulatory Visit (HOSPITAL_COMMUNITY)
Admission: RE | Admit: 2023-05-01 | Discharge: 2023-05-01 | Disposition: A | Discharge status: Home / Self Care | Payer: BC Managed Care – PPO | Source: Ambulatory Visit | Attending: Cardiology | Admitting: Cardiology

## 2023-05-01 DIAGNOSIS — R072 Precordial pain: Secondary | ICD-10-CM | POA: Diagnosis not present

## 2023-05-01 MED ORDER — METOPROLOL TARTRATE 5 MG/5ML IV SOLN
INTRAVENOUS | Status: AC
  Filled 2023-05-01: qty 10

## 2023-05-01 MED ORDER — METOPROLOL TARTRATE 5 MG/5ML IV SOLN
10.0000 mg | Freq: Once | INTRAVENOUS | Status: AC | PRN
  Administered 2023-05-01: 10 mg via INTRAVENOUS

## 2023-05-01 MED ORDER — IOHEXOL 350 MG/ML SOLN
100.0000 mL | Freq: Once | INTRAVENOUS | Status: AC | PRN
  Administered 2023-05-01: 100 mL via INTRAVENOUS

## 2023-05-01 MED ORDER — NITROGLYCERIN 0.4 MG SL SUBL
0.8000 mg | SUBLINGUAL_TABLET | Freq: Once | SUBLINGUAL | Status: AC
  Administered 2023-05-01: 0.8 mg via SUBLINGUAL

## 2023-05-01 MED ORDER — NITROGLYCERIN 0.4 MG SL SUBL
SUBLINGUAL_TABLET | SUBLINGUAL | Status: AC
  Filled 2023-05-01: qty 2

## 2023-05-01 MED ORDER — SODIUM CHLORIDE 0.9 % IV BOLUS: 500.0000 mL | Freq: Once | INTRAVENOUS | Status: DC

## 2023-05-15 DIAGNOSIS — Z8639 Personal history of other endocrine, nutritional and metabolic disease: Secondary | ICD-10-CM | POA: Diagnosis not present

## 2023-05-15 DIAGNOSIS — Z13220 Encounter for screening for lipoid disorders: Secondary | ICD-10-CM | POA: Diagnosis not present

## 2023-05-15 DIAGNOSIS — F17210 Nicotine dependence, cigarettes, uncomplicated: Secondary | ICD-10-CM | POA: Diagnosis not present

## 2023-05-15 DIAGNOSIS — Z130 Encounter for screening for diseases of the blood and blood-forming organs and certain disorders involving the immune mechanism: Secondary | ICD-10-CM | POA: Diagnosis not present

## 2023-05-15 DIAGNOSIS — I10 Essential (primary) hypertension: Secondary | ICD-10-CM | POA: Diagnosis not present

## 2023-05-15 DIAGNOSIS — N529 Male erectile dysfunction, unspecified: Secondary | ICD-10-CM | POA: Diagnosis not present

## 2023-05-15 DIAGNOSIS — Z0000 Encounter for general adult medical examination without abnormal findings: Secondary | ICD-10-CM | POA: Diagnosis not present

## 2023-05-19 ENCOUNTER — Ambulatory Visit: Payer: BC Managed Care – PPO | Attending: Cardiology

## 2023-05-19 NOTE — Progress Notes (Unsigned)
Patient ID: Caleb Norris                 DOB: 13-Nov-1980                      MRN: [id number]      HPI: Caleb Norris is a 42 y.o. male referred by Dr. Anne Fu to HTN clinic. PMH is significant for HTN, IBS, metabolic syndrome, and OSA.   Patient was last seen by Dr. Anne Fu on 10/25. Patient reported previous BP readings of 150/100 and 140/100. Reported highest home BP reading was 210/120. Denied  Patient reported that he stopped taking amlodipine and losartan due to itching of lower extremities that led to skin breaking and bleeding. Increased lisinopril 20mg  to 40mg  daily. Added HCTZ 25mg  daily.   Patient seen in clinic today for blood pressure control. Patients BP readings in clinic was *** and *** and a HR of ***.   Current HTN meds: HCTZ 25mg  daily, lisinopril 40mg  daily Previously tried: Losartan (lower extremity itching), amlodipine (lower extremity itching) BP goal:   Family History: Mother: HTN; Father: HTN  Social History: current smoker, very rare social alcohol consumption   Diet:   Exercise:  {types:28256}  Home BP readings:  Date SBP/DBP  HR                              Average      Wt Readings from Last 3 Encounters:  04/17/23 231 lb (104.8 kg)  03/04/23 227 lb 6.4 oz (103.1 kg)  01/01/23 235 lb 6.4 oz (106.8 kg)   BP Readings from Last 3 Encounters:  05/01/23 (!) 108/57  04/17/23 (!) 140/92  04/01/23 (!) 150/100   Pulse Readings from Last 3 Encounters:  05/01/23 77  04/17/23 98  04/01/23 95    Renal function: CrCl cannot be calculated (Patient's most recent lab result is older than the maximum 21 days allowed.).  Past Medical History:  Diagnosis Date   Gastroesophageal reflux disease without esophagitis 05/07/2020   GERD (gastroesophageal reflux disease)    Headache    occassionally   History of kidney stones    IBS (irritable bowel syndrome)    Metabolic syndrome 2018   Trigs, HDL, waist circ   Obesity, Class I, BMI 30-34.9     OSA (obstructive sleep apnea) 12/2018   CPAP recommended 12/2018   Wears contact lenses    Wears glasses     Current Outpatient Medications on File Prior to Visit  Medication Sig Dispense Refill   atorvastatin (LIPITOR) 10 MG tablet 1 tab po at bedtime x2 weekly 24 tablet 3   b complex vitamins tablet Take 1 tablet by mouth daily.     cetirizine (ZYRTEC) 10 MG tablet Take 10 mg by mouth daily.     hydrochlorothiazide (HYDRODIURIL) 25 MG tablet Take 1 tablet (25 mg total) by mouth daily. 90 tablet 3   lisinopril (ZESTRIL) 40 MG tablet Take 1 tablet (40 mg total) by mouth daily. 90 tablet 3   metoprolol tartrate (LOPRESSOR) 100 MG tablet Take 1 tablet (100 mg total) by mouth as directed. Take one tablet (2) hours before your CT scan 1 tablet 0   Multiple Vitamin (MULTIVITAMIN) tablet Take 1 tablet by mouth daily.     tirzepatide (MOUNJARO) 10 MG/0.5ML Pen Inject 10 mg into the skin once a week. 6 mL 1   vitamin B-12 (CYANOCOBALAMIN) 500 MCG tablet  Take by mouth.     No current facility-administered medications on file prior to visit.    Allergies  Allergen Reactions   Compazine [Prochlorperazine Edisylate] Anaphylaxis   Amlodipine Other (See Comments)    itch   Losartan     itch    There were no vitals taken for this visit.   Assessment/Plan: No BP recorded.  {Refresh Note OR Click here to enter BP  :1}***   1. Hypertension -  No problem-specific Assessment & Plan notes found for this encounter. Assessment: Patients BP is ***controlled/uncontrolled at *** with a goal <130/80 Patient is currently taking lisinopril 40mg  daily and hydrochlorothiazide 25mg  daily Patient has previous intolerances to amlodipine and losartan due to lower extremity itching that led skin to break and bleed.    Plan: Labs today    Thank you,  Minette Brine, Student Pharmacist  Olene Floss, Pharm.D, BCACP, BCPS, CPP [facility] Division of [facility] [address] [place], [place] [address]  Phone: [phone]; Fax: [phone]

## 2023-05-20 ENCOUNTER — Other Ambulatory Visit (HOSPITAL_BASED_OUTPATIENT_CLINIC_OR_DEPARTMENT_OTHER): Payer: Self-pay

## 2023-05-26 ENCOUNTER — Encounter: Payer: Self-pay | Admitting: Family Medicine

## 2023-06-19 ENCOUNTER — Encounter: Payer: Self-pay | Admitting: Cardiology

## 2023-07-03 DIAGNOSIS — D7389 Other diseases of spleen: Secondary | ICD-10-CM | POA: Diagnosis not present

## 2023-07-03 DIAGNOSIS — R1032 Left lower quadrant pain: Secondary | ICD-10-CM | POA: Diagnosis not present

## 2023-07-03 DIAGNOSIS — Z1330 Encounter for screening examination for mental health and behavioral disorders, unspecified: Secondary | ICD-10-CM | POA: Diagnosis not present

## 2023-07-03 DIAGNOSIS — R1031 Right lower quadrant pain: Secondary | ICD-10-CM | POA: Diagnosis not present

## 2023-07-29 DIAGNOSIS — K219 Gastro-esophageal reflux disease without esophagitis: Secondary | ICD-10-CM | POA: Diagnosis not present

## 2023-07-29 DIAGNOSIS — R195 Other fecal abnormalities: Secondary | ICD-10-CM | POA: Diagnosis not present

## 2023-07-29 DIAGNOSIS — R1030 Lower abdominal pain, unspecified: Secondary | ICD-10-CM | POA: Diagnosis not present

## 2023-08-04 DIAGNOSIS — D122 Benign neoplasm of ascending colon: Secondary | ICD-10-CM | POA: Diagnosis not present

## 2023-08-04 DIAGNOSIS — R194 Change in bowel habit: Secondary | ICD-10-CM | POA: Diagnosis not present

## 2023-08-04 DIAGNOSIS — D125 Benign neoplasm of sigmoid colon: Secondary | ICD-10-CM | POA: Diagnosis not present

## 2023-08-04 DIAGNOSIS — D124 Benign neoplasm of descending colon: Secondary | ICD-10-CM | POA: Diagnosis not present

## 2023-08-04 DIAGNOSIS — K635 Polyp of colon: Secondary | ICD-10-CM | POA: Diagnosis not present

## 2023-09-11 ENCOUNTER — Other Ambulatory Visit: Payer: Self-pay

## 2023-09-11 ENCOUNTER — Other Ambulatory Visit (HOSPITAL_BASED_OUTPATIENT_CLINIC_OR_DEPARTMENT_OTHER): Payer: Self-pay

## 2023-09-11 DIAGNOSIS — Z8639 Personal history of other endocrine, nutritional and metabolic disease: Secondary | ICD-10-CM | POA: Diagnosis not present

## 2023-09-11 DIAGNOSIS — K219 Gastro-esophageal reflux disease without esophagitis: Secondary | ICD-10-CM | POA: Diagnosis not present

## 2023-09-11 DIAGNOSIS — I10 Essential (primary) hypertension: Secondary | ICD-10-CM | POA: Diagnosis not present

## 2023-09-11 MED ORDER — MOUNJARO 2.5 MG/0.5ML ~~LOC~~ SOAJ
2.5000 mg | SUBCUTANEOUS | 0 refills | Status: AC
  Filled 2023-09-11: qty 2, 28d supply, fill #0

## 2023-10-05 DIAGNOSIS — H00015 Hordeolum externum left lower eyelid: Secondary | ICD-10-CM | POA: Diagnosis not present

## 2023-10-13 ENCOUNTER — Other Ambulatory Visit (HOSPITAL_BASED_OUTPATIENT_CLINIC_OR_DEPARTMENT_OTHER): Payer: Self-pay

## 2023-10-13 ENCOUNTER — Encounter (HOSPITAL_BASED_OUTPATIENT_CLINIC_OR_DEPARTMENT_OTHER): Payer: Self-pay

## 2023-10-13 ENCOUNTER — Ambulatory Visit (HOSPITAL_BASED_OUTPATIENT_CLINIC_OR_DEPARTMENT_OTHER)
Admission: EM | Admit: 2023-10-13 | Discharge: 2023-10-13 | Disposition: A | Discharge status: Home / Self Care | Attending: Family Medicine | Admitting: Family Medicine

## 2023-10-13 DIAGNOSIS — H01004 Unspecified blepharitis left upper eyelid: Secondary | ICD-10-CM

## 2023-10-13 MED ORDER — ERYTHROMYCIN 5 MG/GM OP OINT
TOPICAL_OINTMENT | OPHTHALMIC | 0 refills | Status: AC
Start: 2023-10-13 — End: ?
  Filled 2023-10-13: qty 3.5, 7d supply, fill #0
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
## 2023-10-13 NOTE — Discharge Instructions (Signed)
 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  I am recommending doing warm compresses religiously over the next 24 hours and extending past that if needed. I am prescribing an antibiotic ointment to place in the eyelid. Also you may need doing gentle cleanses with baby soap to the eye to help clean out any debris that may be in the follicles. If your things worsen please follow-up

## 2023-10-13 NOTE — ED Provider Notes (Signed)
 [facility]    CSN: [id number] Arrival date & time: 10/13/23  0945      History   Chief Complaint Chief Complaint  Patient presents with   Facial Swelling    HPI [name] is a 43 y.o. male.   Patient is a 43 year old male who presents today with left upper lid swelling.  This started yesterday and worsening today.  History of previous stye to the left lower lid which was recently treated with antibiotic eyedrops and warm compresses.  This did resolve.  He tried to treat this new problem with the same and the problem has worsened. The lid is tender but he is not having any pain in the eye or trouble seeing other than due to the swelling.  He does wear glasses.  Denies any concern for foreign bodies in the eye.  Denies any fever.     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 % 03/09/2023   Erectile dysfunction 01/01/2023   Primary hypertension 09/01/2022   Diabetes mellitus (HCC) 12/05/2021   Morbid obesity with BMI of 40.0-44.9, adult (HCC) 12/05/2021   Cigarette nicotine dependence without complication 12/05/2021   Encounter for smoking cessation counseling 12/05/2021   OSA (obstructive sleep apnea) 05/07/2020   Obesity with alveolar hypoventilation and body mass index (BMI) of 40 or greater (HCC) 02/04/2019   IBS (irritable bowel syndrome) 08/12/2018   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    Past Surgical History:  Procedure Laterality Date    CYSTOSCOPY/URETEROSCOPY/HOLMIUM LASER/STENT PLACEMENT Right 03/31/2019   Procedure: CYSTOSCOPY RIGHT RETROGRAE PYELOGRAM /URETEROSCOPY HOLMIUM LASER/STENT PLACEMENT;  Surgeon: Homero Luster, MD;  Location: [facility];  Service: Urology;  Laterality: Right;   LEG SURGERY Right    MULTIPLE TOOTH EXTRACTIONS     THYROGLOSSAL DUCT CYST Left 03/16/2015   Procedure: Excision Left neck cyst;  Surgeon: Virgina Grills, MD;  Location: [facility] OR;  Service: ENT;  Laterality: Left;       Home Medications    Prior to Admission medications   Medication Sig Start Date End Date Taking? Authorizing Provider  erythromycin  ophthalmic ointment Place a 1/2 inch ribbon of ointment into the lower eyelid up to 6 times a day for 7 days. 10/13/23  Yes Navea Woodrow A, FNP  atorvastatin  (LIPITOR) 10 MG tablet 1 tab po at bedtime x2 weekly 03/09/23   Kuneff, Renee A, DO  b complex vitamins tablet Take 1 tablet by mouth daily.    [provider]  cetirizine (ZYRTEC) 10 MG tablet Take 10 mg by mouth daily.    [provider]  hydrochlorothiazide  (HYDRODIURIL ) 25 MG tablet Take 1 tablet (25 mg total) by mouth daily. 04/17/23   Hugh Madura, MD  lisinopril  (ZESTRIL ) 40 MG tablet Take 1 tablet (40 mg total) by mouth daily. 04/17/23 07/16/23  Hugh Madura, MD  metoprolol  tartrate (LOPRESSOR ) 100 MG tablet Take 1 tablet (100  mg total) by mouth as directed. Take one tablet (2) hours before your CT scan 04/17/23   Hugh Madura, MD  Multiple Vitamin (MULTIVITAMIN) tablet Take 1 tablet by mouth daily.    [provider]  tirzepatide  (MOUNJARO ) 10 MG/0.5ML Pen Inject 10 mg into the skin once a week. 01/01/23   Kuneff, Renee A, DO  tirzepatide  (MOUNJARO ) 2.5 MG/0.5ML Pen Inject 2.5 mg into the skin once a week. 09/11/23     vitamin B-12 (CYANOCOBALAMIN) 500 MCG tablet Take by mouth.    [provider]    Family History Family History  Problem Relation Age of Onset   Hyperlipidemia  Father    Hypertension Father    Heart disease Maternal Grandfather    Hyperlipidemia Maternal Grandfather    Hypertension Maternal Grandfather    Heart attack Maternal Grandfather    Stroke Neg Hx    Diabetes Neg Hx    Cancer Neg Hx     Social History Social History   Tobacco Use   Smoking status: Every Day    Current packs/day: 1.00    Types: Cigarettes   Smokeless tobacco: Never  Vaping Use   Vaping status: Never Used  Substance Use Topics   Alcohol use: Yes    Alcohol/week: 3.0 - 4.0 standard drinks of alcohol    Types: 3 - 4 Cans of beer per week    Comment: weekly   Drug use: No     Allergies   Compazine [prochlorperazine edisylate], Amlodipine , and Losartan    Review of Systems Review of Systems See HPI   Physical Exam Triage Vital Signs ED Triage Vitals  Encounter Vitals Group     BP 10/13/23 1031 120/78     Systolic BP Percentile --      Diastolic BP Percentile --      Pulse Rate 10/13/23 1031 86     Resp 10/13/23 1031 20     Temp 10/13/23 1031 98.6 F (37 C)     Temp Source 10/13/23 1031 Oral     SpO2 10/13/23 1031 96 %     Weight --      Height --      Head Circumference --      Peak Flow --      Pain Score 10/13/23 1033 6     Pain Loc --      Pain Education --      Exclude from Growth Chart --    No data found.  Updated Vital Signs BP 120/78 (BP Location: Right Arm)   Pulse 86   Temp 98.6 F (37 C) (Oral)   Resp 20   SpO2 96%   Visual Acuity Right Eye Distance:   Left Eye Distance:   Bilateral Distance:    Right Eye Near:   Left Eye Near:    Bilateral Near:     Physical Exam Eyes:     General:        Left eye: No foreign body.     Conjunctiva/sclera:     Right eye: Right conjunctiva is not injected. No chemosis, exudate or hemorrhage.    Left eye: Left conjunctiva is not injected. No chemosis, exudate or hemorrhage.    Comments: Mild discharge from the left eyelid  Moderate swelling to the left upper lid with erythema.   Small stye.  No periorbital pain   Neurological:     Mental Status: He is alert.      UC Treatments / Results  Labs (  all labs ordered are listed, but only abnormal results are displayed) Labs Reviewed - No data to display  EKG   Radiology No results found.  Procedures Procedures (including critical care time)  Medications Ordered in UC Medications - No data to display  Initial Impression / Assessment and Plan / UC Course  I have reviewed the triage vital signs and the nursing notes.  Pertinent labs & imaging results that were available during my care of the patient were reviewed by me and considered in my medical decision making (see chart for details).     Blepharitis- No concerns for periorbital cellulitis . Treating with warm compresses to the lid area.  Recommended cleansing the area with gentle soap to wash out any debris. Erythromycin  ointment 3-4 times a day for at least a week Follow-up for any continued issues Final Clinical Impressions(s) / UC Diagnoses   Final diagnoses:  Blepharitis of left upper eyelid, unspecified type     Discharge Instructions      This is blepharitis to the left upper lid.  I am recommending doing warm compresses religiously over the next 24 hours and extending past that if needed. I am prescribing an antibiotic ointment to place in the eyelid. Also you may need doing gentle cleanses with baby soap to the eye to help clean out any debris that may be in the follicles. If your things worsen please follow-up    ED Prescriptions     Medication Sig Dispense Auth. Provider   erythromycin  ophthalmic ointment Place a 1/2 inch ribbon of ointment into the lower eyelid up to 6 times a day for 7 days. 3.5 g Landa Pine, FNP      PDMP not reviewed this encounter.   Landa Pine, FNP 10/13/23 1231

## 2023-10-15 ENCOUNTER — Ambulatory Visit (HOSPITAL_BASED_OUTPATIENT_CLINIC_OR_DEPARTMENT_OTHER)
Admission: EM | Admit: 2023-10-15 | Discharge: 2023-10-15 | Disposition: A | Discharge status: Home / Self Care | Attending: Family Medicine | Admitting: Family Medicine

## 2023-10-15 ENCOUNTER — Other Ambulatory Visit (HOSPITAL_BASED_OUTPATIENT_CLINIC_OR_DEPARTMENT_OTHER): Payer: Self-pay

## 2023-10-15 ENCOUNTER — Encounter (HOSPITAL_BASED_OUTPATIENT_CLINIC_OR_DEPARTMENT_OTHER): Payer: Self-pay | Admitting: Emergency Medicine

## 2023-10-15 DIAGNOSIS — H0100B Unspecified blepharitis left eye, upper and lower eyelids: Secondary | ICD-10-CM | POA: Diagnosis not present

## 2023-10-15 DIAGNOSIS — H00036 Abscess of eyelid left eye, unspecified eyelid: Secondary | ICD-10-CM | POA: Diagnosis not present

## 2023-10-15 MED ORDER — CEFDINIR 300 MG PO CAPS
300.0000 mg | ORAL_CAPSULE | Freq: Two times a day (BID) | ORAL | 0 refills | Status: AC
  Filled 2023-10-15: qty 14, 7d supply, fill #0

## 2023-10-15 MED ORDER — CIPROFLOXACIN HCL 0.3 % OP SOLN
2.0000 [drp] | OPHTHALMIC | 0 refills | Status: AC
  Filled 2023-10-15: qty 5, 10d supply, fill #0

## 2023-10-15 NOTE — ED Provider Notes (Signed)
 [facility]    CSN: [id number] Arrival date & time: 10/15/23  4098      History   Chief Complaint No chief complaint on file.   HPI Caleb Norris is a 43 y.o. male.   Patient had some oxifloxacin drops from his primary care provider for left lower lid stye in March of 2025.  That eye drop seemed to cleared up the eye infection initially.  He was seen here on 10/13/2023 and treated for blepharitis with erythromycin  ointment.  At one point he had a pretty large pustule on his left upper eyelid that he thought was a stye.  He sterilized a pair of tweezers with rubbing alcohol and popped and squeezed the stye and continue to use the erythromycin  ointment.  Initially he thought his eye got better significantly.  Yesterday his eye started getting worse again and today he woke up with his eye swollen nearly completely shut on the left.  There are styes or pustules in both the lower and upper eyelid.  His visual acuity was 20/25 in both eyes with glasses on.  He normally wears contacts but has not been wearing the contacts during all these eye infections.     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 % 03/09/2023   Erectile dysfunction 01/01/2023   Primary hypertension 09/01/2022   Diabetes mellitus (HCC) 12/05/2021   Morbid obesity with BMI of 40.0-44.9, adult (HCC) 12/05/2021   Cigarette nicotine dependence without complication 12/05/2021   Encounter for smoking  cessation counseling 12/05/2021   OSA (obstructive sleep apnea) 05/07/2020   Obesity with alveolar hypoventilation and body mass index (BMI) of 40 or greater (HCC) 02/04/2019   IBS (irritable bowel syndrome) 08/12/2018   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    Past Surgical History:  Procedure Laterality Date   CYSTOSCOPY/URETEROSCOPY/HOLMIUM LASER/STENT PLACEMENT Right 03/31/2019   Procedure: CYSTOSCOPY RIGHT RETROGRAE PYELOGRAM /URETEROSCOPY HOLMIUM LASER/STENT PLACEMENT;  Surgeon: Homero Luster, MD;  Location: [facility];  Service: Urology;  Laterality: Right;   LEG SURGERY Right    MULTIPLE TOOTH EXTRACTIONS     THYROGLOSSAL DUCT CYST Left 03/16/2015   Procedure: Excision Left neck cyst;  Surgeon: Virgina Grills, MD;  Location: [facility] OR;  Service: ENT;  Laterality: Left;       Home Medications    Prior to Admission medications   Medication Sig Start Date End Date Taking? Authorizing Provider  cefdinir  (OMNICEF ) 300 MG capsule Take 1 capsule (300 mg total) by mouth 2 (two) times daily for 7 days. 10/15/23 10/22/23 Yes Guss Legacy, FNP  ciprofloxacin  (CILOXAN ) 0.3 % ophthalmic solution Administer 1 drop into left eye every 2 hours, while awake, for 2 days. Then 1 drop, every 4 hours, while awake, for the next 5 days. 10/15/23 10/25/23 Yes Guss Legacy, FNP  atorvastatin  (LIPITOR) 10 MG tablet 1 tab po at bedtime x2 weekly 03/09/23   Mariel Shope,  DO  b complex vitamins tablet Take 1 tablet by mouth daily.    [provider]  cetirizine (ZYRTEC) 10 MG tablet Take 10 mg by mouth daily.    [provider]  erythromycin  ophthalmic ointment Place a 1/2 inch ribbon of ointment into the lower eyelid up to 6 times a day for 7 days. 10/13/23   Jerri Morale A, FNP  hydrochlorothiazide  (HYDRODIURIL ) 25 MG tablet Take 1 tablet (25 mg total) by mouth daily. 04/17/23   Hugh Madura, MD  lisinopril  (ZESTRIL ) 40 MG tablet Take 1 tablet (40 mg total) by mouth daily. 04/17/23  07/16/23  Hugh Madura, MD  metoprolol  tartrate (LOPRESSOR ) 100 MG tablet Take 1 tablet (100 mg total) by mouth as directed. Take one tablet (2) hours before your CT scan 04/17/23   Hugh Madura, MD  Multiple Vitamin (MULTIVITAMIN) tablet Take 1 tablet by mouth daily.    [provider]  tirzepatide  (MOUNJARO ) 10 MG/0.5ML Pen Inject 10 mg into the skin once a week. 01/01/23   Kuneff, Renee A, DO  tirzepatide  (MOUNJARO ) 2.5 MG/0.5ML Pen Inject 2.5 mg into the skin once a week. 09/11/23     vitamin B-12 (CYANOCOBALAMIN) 500 MCG tablet Take by mouth.    [provider]    Family History Family History  Problem Relation Age of Onset   Hyperlipidemia Father    Hypertension Father    Heart disease Maternal Grandfather    Hyperlipidemia Maternal Grandfather    Hypertension Maternal Grandfather    Heart attack Maternal Grandfather    Stroke Neg Hx    Diabetes Neg Hx    Cancer Neg Hx     Social History Social History   Tobacco Use   Smoking status: Every Day    Current packs/day: 1.00    Types: Cigarettes   Smokeless tobacco: Never  Vaping Use   Vaping status: Never Used  Substance Use Topics   Alcohol use: Yes    Alcohol/week: 3.0 - 4.0 standard drinks of alcohol    Types: 3 - 4 Cans of beer per week    Comment: weekly   Drug use: No     Allergies   Compazine [prochlorperazine edisylate], Amlodipine , and Losartan    Review of Systems Review of Systems  Constitutional:  Negative for fever.  Eyes:  Positive for pain, discharge and redness.  Respiratory:  Negative for cough.   Cardiovascular:  Negative for chest pain.  Gastrointestinal:  Negative for abdominal pain, constipation, diarrhea, nausea and vomiting.  Musculoskeletal:  Negative for arthralgias and back pain.  Skin:  Negative for color change and rash.  Neurological:  Negative for syncope.  All other systems reviewed and are negative.    Physical Exam Triage Vital Signs ED Triage Vitals   Encounter Vitals Group     BP 10/15/23 0831 122/82     Systolic BP Percentile --      Diastolic BP Percentile --      Pulse Rate 10/15/23 0831 98     Resp 10/15/23 0831 18     Temp 10/15/23 0831 98.1 F (36.7 C)     Temp Source 10/15/23 0831 Oral     SpO2 10/15/23 0831 95 %     Weight --      Height --      Head Circumference --      Peak Flow --      Pain Score 10/15/23 0830 5     Pain Loc --  Pain Education --      Exclude from Growth Chart --    No data found.  Updated Vital Signs BP 122/82 (BP Location: Right Arm)   Pulse 98   Temp 98.1 F (36.7 C) (Oral)   Resp 18   SpO2 95%   Visual Acuity Right Eye Distance: 20/25 Left Eye Distance: 20/25 Bilateral Distance: 20/25  Right Eye Near:   Left Eye Near:    Bilateral Near:     Physical Exam Vitals and nursing note reviewed.  Constitutional:      General: He is not in acute distress.    Appearance: He is well-developed. He is not ill-appearing or toxic-appearing.  HENT:     Head: Normocephalic and atraumatic.     Right Ear: Hearing, tympanic membrane, ear canal and external ear normal.     Left Ear: Hearing, tympanic membrane, ear canal and external ear normal.     Nose: No congestion or rhinorrhea.     Right Sinus: No maxillary sinus tenderness or frontal sinus tenderness.     Left Sinus: No maxillary sinus tenderness or frontal sinus tenderness.     Mouth/Throat:     Lips: Pink.     Mouth: Mucous membranes are moist.     Pharynx: Uvula midline. No oropharyngeal exudate or posterior oropharyngeal erythema.     Tonsils: No tonsillar exudate.  Eyes:     General:        Left eye: Hordeolum (Upper and lower lids laterally with significant erythema of both lids and moderate to severe edema of the left upper lid.) present.    Conjunctiva/sclera: Conjunctivae normal.     Pupils: Pupils are equal, round, and reactive to light.  Cardiovascular:     Rate and Rhythm: Normal rate and regular rhythm.     Heart  sounds: S1 normal and S2 normal. No murmur heard. Pulmonary:     Effort: Pulmonary effort is normal. No respiratory distress.     Breath sounds: Normal breath sounds. No decreased breath sounds, wheezing, rhonchi or rales.  Musculoskeletal:        General: No swelling.     Cervical back: Neck supple.  Lymphadenopathy:     Head:     Right side of head: Submandibular adenopathy present. No submental, tonsillar, preauricular or posterior auricular adenopathy.     Left side of head: Submandibular adenopathy present. No submental, tonsillar, preauricular or posterior auricular adenopathy.     Cervical: No cervical adenopathy.     Right cervical: No superficial cervical adenopathy.    Left cervical: No superficial cervical adenopathy.  Skin:    General: Skin is warm and dry.     Capillary Refill: Capillary refill takes less than 2 seconds.     Findings: No rash.  Neurological:     Mental Status: He is alert and oriented to person, place, and time.  Psychiatric:        Mood and Affect: Mood normal.      UC Treatments / Results  Labs (all labs ordered are listed, but only abnormal results are displayed) Labs Reviewed - No data to display  EKG   Radiology No results found.  Procedures Procedures (including critical care time)  Medications Ordered in UC Medications - No data to display  Initial Impression / Assessment and Plan / UC Course  I have reviewed the triage vital signs and the nursing notes.  Pertinent labs & imaging results that were available during my care of the patient were  reviewed by me and considered in my medical decision making (see chart for details).     Plan of Care: Blepharitis of both upper and lower lids of the left eye: Will treat with Cipro  floxacillin ophthalmic solution, 1 drop every 4 hours while awake for 5 to 10 days.  Continue with cleaning the eye with [employer] baby shampoo and warm water rinses.  If eye does not significantly  improve in 2 to 5 days needs to see optometry or ophthalmology.  Do not use contacts while this infection is going on. Cellulitis of the left eyelid.  Discouraged from popping the styes as it just spreads infection.  Cefdinir , 300 mg twice daily for 7 days.  Warm compresses are helpful.  Follow-up with ophthalmology or optometry if symptoms do not improve, worsen or new symptoms occur.  Patient is always welcome to follow-up here also. Final Clinical Impressions(s) / UC Diagnoses   Final diagnoses:  Blepharitis of both upper and lower eyelid of left eye, unspecified type  Cellulitis of left eyelid     Discharge Instructions      Eye exam is consistent with infectious blepharitis of the upper and lower left eyelids with a secondary cellulitis.  Will treat with Cipro  ophthalmic drops 2 drops every 4 hours while awake for 5 to 10 days.  Do not wear contact lenses during this time.  Do not use any more erythromycin  for now.  Provided cefdinir , 300 mg 1 twice daily for 7 days.  Encouraged to see ophthalmologist or optometrist for further evaluation of left eye.  There are 2 distinct palpable pustules (1 on the lower lid and 1 on the upper lid).  Sometimes these pustules need to be drained medically.  Ophthalmology or optometry can provide better insight for this.  Follow-up as needed.     ED Prescriptions     Medication Sig Dispense Auth. Provider   cefdinir  (OMNICEF ) 300 MG capsule Take 1 capsule (300 mg total) by mouth 2 (two) times daily for 7 days. 14 capsule Guss Legacy, FNP   ciprofloxacin  (CILOXAN ) 0.3 % ophthalmic solution Administer 1 drop into left eye every 2 hours, while awake, for 2 days. Then 1 drop, every 4 hours, while awake, for the next 5 days. 5 mL Guss Legacy, FNP      PDMP not reviewed this encounter.   Guss Legacy, FNP 10/15/23 [phone]

## 2023-10-15 NOTE — ED Triage Notes (Signed)
 Left eye still swollen it did get some better but then he woke up this morning with eye almost completely closed. Pt has been doing the warm compress and the ointment. Visual acuity 20/25 in both eye with glasses on.

## 2023-10-15 NOTE — Discharge Instructions (Addendum)
 Eye exam is consistent with infectious blepharitis of the upper and lower left eyelids with a secondary cellulitis.  Will treat with Cipro  ophthalmic drops 2 drops every 4 hours while awake for 5 to 10 days.  Do not wear contact lenses during this time.  Do not use any more erythromycin  for now.  Provided cefdinir , 300 mg 1 twice daily for 7 days.  Encouraged to see ophthalmologist or optometrist for further evaluation of left eye.  There are 2 distinct palpable pustules (1 on the lower lid and 1 on the upper lid).  Sometimes these pustules need to be drained medically.  Ophthalmology or optometry can provide better insight for this.  Follow-up as needed.

## 2023-12-03 DIAGNOSIS — H00012 Hordeolum externum right lower eyelid: Secondary | ICD-10-CM | POA: Diagnosis not present

## 2024-01-04 DIAGNOSIS — H00012 Hordeolum externum right lower eyelid: Secondary | ICD-10-CM | POA: Diagnosis not present

## 2024-01-05 ENCOUNTER — Other Ambulatory Visit (HOSPITAL_COMMUNITY): Payer: Self-pay

## 2024-01-19 NOTE — Procedures (Signed)
Result scanned to media

## 2024-01-22 DIAGNOSIS — H00015 Hordeolum externum left lower eyelid: Secondary | ICD-10-CM | POA: Diagnosis not present

## 2024-01-26 ENCOUNTER — Other Ambulatory Visit: Payer: Self-pay | Admitting: Cardiology

## 2024-01-27 DIAGNOSIS — H00015 Hordeolum externum left lower eyelid: Secondary | ICD-10-CM | POA: Diagnosis not present

## 2024-02-29 ENCOUNTER — Other Ambulatory Visit: Payer: Self-pay | Admitting: Cardiology

## 2024-04-08 DIAGNOSIS — H9203 Otalgia, bilateral: Secondary | ICD-10-CM | POA: Diagnosis not present

## 2024-04-08 DIAGNOSIS — J029 Acute pharyngitis, unspecified: Secondary | ICD-10-CM | POA: Diagnosis not present

## 2024-04-08 DIAGNOSIS — R059 Cough, unspecified: Secondary | ICD-10-CM | POA: Diagnosis not present

## 2024-04-08 DIAGNOSIS — Z03818 Encounter for observation for suspected exposure to other biological agents ruled out: Secondary | ICD-10-CM | POA: Diagnosis not present

## 2024-04-26 DIAGNOSIS — J209 Acute bronchitis, unspecified: Secondary | ICD-10-CM | POA: Diagnosis not present

## 2024-04-26 DIAGNOSIS — R519 Headache, unspecified: Secondary | ICD-10-CM | POA: Diagnosis not present
